# Patient Record
Sex: Female | Born: 1990 | Race: White | Hispanic: No | Marital: Single | Smoking: Current every day smoker
Health system: Southern US, Community
[De-identification: ages and names within clinical notes are randomized; demographics above are authoritative.]

## PROBLEM LIST (undated history)

## (undated) ENCOUNTER — Inpatient Hospital Stay: Payer: Self-pay

## (undated) DIAGNOSIS — R87619 Unspecified abnormal cytological findings in specimens from cervix uteri: Secondary | ICD-10-CM

## (undated) DIAGNOSIS — Z9289 Personal history of other medical treatment: Secondary | ICD-10-CM

## (undated) HISTORY — DX: Unspecified abnormal cytological findings in specimens from cervix uteri: R87.619

## (undated) HISTORY — PX: NO PAST SURGERIES: SHX2092

## (undated) HISTORY — DX: Personal history of other medical treatment: Z92.89

---

## 2009-05-09 ENCOUNTER — Ambulatory Visit: Payer: Self-pay | Admitting: Family Medicine

## 2010-06-09 HISTORY — PX: WISDOM TOOTH EXTRACTION: SHX21

## 2010-09-22 ENCOUNTER — Inpatient Hospital Stay: Payer: Self-pay | Admitting: Obstetrics and Gynecology

## 2012-10-17 ENCOUNTER — Inpatient Hospital Stay: Payer: Self-pay | Admitting: Obstetrics & Gynecology

## 2012-10-17 LAB — CBC WITH DIFFERENTIAL/PLATELET
Basophil #: 0 10*3/uL (ref 0.0–0.1)
Basophil %: 0.3 %
Eosinophil #: 0.1 10*3/uL (ref 0.0–0.7)
Eosinophil %: 0.4 %
HGB: 12.7 g/dL (ref 12.0–16.0)
Lymphocyte %: 16.4 %
MCH: 29.7 pg (ref 26.0–34.0)
MCHC: 34.7 g/dL (ref 32.0–36.0)
MCV: 86 fL (ref 80–100)
Monocyte #: 0.9 x10 3/mm (ref 0.2–0.9)
Neutrophil #: 10.7 10*3/uL — ABNORMAL HIGH (ref 1.4–6.5)
RBC: 4.29 10*6/uL (ref 3.80–5.20)
RDW: 14.2 % (ref 11.5–14.5)
WBC: 14 10*3/uL — ABNORMAL HIGH (ref 3.6–11.0)

## 2012-10-18 LAB — GC/CHLAMYDIA PROBE AMP

## 2012-10-19 LAB — HEMATOCRIT: HCT: 33.5 % — ABNORMAL LOW (ref 35.0–47.0)

## 2013-06-09 DIAGNOSIS — R87619 Unspecified abnormal cytological findings in specimens from cervix uteri: Secondary | ICD-10-CM

## 2013-06-09 HISTORY — DX: Unspecified abnormal cytological findings in specimens from cervix uteri: R87.619

## 2014-01-16 ENCOUNTER — Ambulatory Visit: Payer: Self-pay | Admitting: Physician Assistant

## 2014-01-16 LAB — RAPID STREP-A WITH REFLX: MICRO TEXT REPORT: NEGATIVE

## 2014-01-20 LAB — BETA STREP CULTURE(ARMC)

## 2014-03-04 ENCOUNTER — Ambulatory Visit: Payer: Self-pay | Admitting: Physician Assistant

## 2014-03-04 LAB — URINALYSIS, COMPLETE
BACTERIA: NEGATIVE
BLOOD: NEGATIVE
Bilirubin,UR: NEGATIVE
Glucose,UR: NEGATIVE
KETONE: NEGATIVE
LEUKOCYTE ESTERASE: NEGATIVE
Nitrite: NEGATIVE
PH: 7.5 (ref 5.0–8.0)
PROTEIN: NEGATIVE
RBC, UR: NONE SEEN /HPF (ref 0–5)
Specific Gravity: 1.025 (ref 1.000–1.030)
WBC UR: NONE SEEN /HPF (ref 0–5)

## 2014-03-06 LAB — URINE CULTURE

## 2014-06-22 ENCOUNTER — Ambulatory Visit: Payer: Self-pay | Admitting: Physician Assistant

## 2014-06-22 LAB — BASIC METABOLIC PANEL
Anion Gap: 8 (ref 7–16)
BUN: 13 mg/dL (ref 7–18)
CREATININE: 0.82 mg/dL (ref 0.60–1.30)
Calcium, Total: 9.1 mg/dL (ref 8.5–10.1)
Chloride: 105 mmol/L (ref 98–107)
Co2: 28 mmol/L (ref 21–32)
EGFR (African American): >60
GLUCOSE: 91 mg/dL (ref 65–99)
Osmolality: 281 (ref 275–301)
POTASSIUM: 4.1 mmol/L (ref 3.5–5.1)
Sodium: 141 mmol/L (ref 136–145)

## 2014-06-22 LAB — CBC WITH DIFFERENTIAL/PLATELET
BASOS ABS: 0.1 10*3/uL (ref 0.0–0.1)
Basophil %: 1.1 %
Eosinophil #: 0.1 10*3/uL (ref 0.0–0.7)
Eosinophil %: 1.5 %
HCT: 45.4 % (ref 35.0–47.0)
HGB: 14.8 g/dL (ref 12.0–16.0)
LYMPHS PCT: 38.4 %
Lymphocyte #: 2.7 10*3/uL (ref 1.0–3.6)
MCH: 29 pg (ref 26.0–34.0)
MCHC: 32.7 g/dL (ref 32.0–36.0)
MCV: 89 fL (ref 80–100)
Monocyte #: 0.5 x10 3/mm (ref 0.2–0.9)
Monocyte %: 6.7 %
NEUTROS ABS: 3.7 10*3/uL (ref 1.4–6.5)
Neutrophil %: 52.3 %
Platelet: 261 10*3/uL (ref 150–440)
RBC: 5.11 10*6/uL (ref 3.80–5.20)
RDW: 13 % (ref 11.5–14.5)
WBC: 7 10*3/uL (ref 3.6–11.0)

## 2014-11-27 ENCOUNTER — Other Ambulatory Visit: Payer: Self-pay | Admitting: Family Medicine

## 2014-11-27 ENCOUNTER — Ambulatory Visit
Admission: RE | Admit: 2014-11-27 | Discharge: 2014-11-27 | Disposition: A | Discharge status: Home / Self Care | Payer: PRIVATE HEALTH INSURANCE | Source: Ambulatory Visit | Attending: Family Medicine | Admitting: Family Medicine

## 2014-11-27 DIAGNOSIS — Z77098 Contact with and (suspected) exposure to other hazardous, chiefly nonmedicinal, chemicals: Secondary | ICD-10-CM

## 2014-11-27 DIAGNOSIS — Z029 Encounter for administrative examinations, unspecified: Secondary | ICD-10-CM | POA: Diagnosis present

## 2015-09-02 ENCOUNTER — Ambulatory Visit
Admission: EM | Admit: 2015-09-02 | Discharge: 2015-09-02 | Disposition: A | Discharge status: Home / Self Care | Payer: PRIVATE HEALTH INSURANCE | Attending: Family Medicine | Admitting: Family Medicine

## 2015-09-02 DIAGNOSIS — J302 Other seasonal allergic rhinitis: Secondary | ICD-10-CM

## 2015-09-02 DIAGNOSIS — T700XXA Otitic barotrauma, initial encounter: Secondary | ICD-10-CM

## 2015-09-02 NOTE — Discharge Instructions (Signed)
Sinus congestion with Eustachian tube dysfunction....  Robitussin DM  ( guafenesin DM )  1 tsp every 4-6 hours  Ceterizine 10 mg 1 tablet daily .Marland Kitchen..for 2-3 days OK to take one tablet twice a day  Fluticasone 1 spray per nostril twice daily for 3-5 days then reduce to daily  Increase fluids by mouth--- steamy showers-- vaporizer....  Barotitis Media Barotitis media is inflammation of your middle ear. This occurs when the auditory tube (eustachian tube) leading from the back of your nose (nasopharynx) to your eardrum is blocked. This blockage may result from a cold, environmental allergies, or an upper respiratory infection. Unresolved barotitis media may lead to damage or hearing loss (barotrauma), which may become permanent. HOME CARE INSTRUCTIONS   Use medicines as recommended by your health care provider. Over-the-counter medicines will help unblock the canal and can help during times of air travel.  Do not put anything into your ears to clean or unplug them. Eardrops will not be helpful.  Do not swim, dive, or fly until your health care provider says it is all right to do so. If these activities are necessary, chewing gum with frequent, forceful swallowing may help. It is also helpful to hold your nose and gently blow to pop your ears for equalizing pressure changes. This forces air into the eustachian tube.  Only take over-the-counter or prescription medicines for pain, discomfort, or fever as directed by your health care provider.  A decongestant may be helpful in decongesting the middle ear and make pressure equalization easier. SEEK MEDICAL CARE IF:  You experience a serious form of dizziness in which you feel as if the room is spinning and you feel nauseated (vertigo).  Your symptoms only involve one ear. SEEK IMMEDIATE MEDICAL CARE IF:   You develop a severe headache, dizziness, or severe ear pain.  You have bloody or pus-like drainage from your ears.  You develop a  fever.  Your problems do not improve or become worse. MAKE SURE YOU:   Understand these instructions.  Will watch your condition.  Will get help right away if you are not doing well or get worse.   This information is not intended to replace advice given to you by your health care provider. Make sure you discuss any questions you have with your health care provider.   Document Released: 05/23/2000 Document Revised: 03/16/2013 Document Reviewed: 12/21/2012 Elsevier Interactive Patient Education [employer]2016 Elsevier Inc.

## 2015-09-02 NOTE — ED Notes (Signed)
Patient c/o sinus pressure, fever, coughing which started 1 week ago.

## 2015-09-02 NOTE — ED Provider Notes (Signed)
CSN: [id number]     Arrival date & time 09/02/15  [id number] History   First MD Initiated Contact with Patient 09/02/15 1010     Chief Complaint  Patient presents with  . URI   (Consider location/radiation/quality/duration/timing/severity/associated sxs/prior Treatment) HPI  25 yo F reporting "about a week" of nasal congestion ...worse over last few days. Now feels underwater...both ears have pressure No thermometer- has felt warm some evenings. No documented fevers, Presents for sinus/ears pressure. No facial pain,no tooth pain . No hx of seasonal allergies but feels this way "in the Spring". Has post nasal drip, ears squeak/pop with gentle valsalva Smokes a pack a day  History reviewed. No pertinent past medical history. History reviewed. No pertinent past surgical history. History reviewed. No pertinent family history. Social History  Substance Use Topics  . Smoking status: Current Every Day Smoker -- 1.00 packs/day    Types: Cigarettes  . Smokeless tobacco: Never Used  . Alcohol Use: Yes     Comment: Occ.   OB History    No data available     Review of Systems Review of 10 systems negative for acute change except as referenced in HPI  Review of patient's allergies indicates no known allergies.  Home Medications   Prior to Admission medications   Medication Sig Start Date End Date Taking? Authorizing Provider  amoxicillin (AMOXIL) 500 MG capsule Take 1 capsule (500 mg total) by mouth 3 (three) times daily. 09/03/15   Duanne Limerickeanna C Jones, MD   Meds Ordered and Administered this Visit  Medications - No data to display  BP 108/73 mmHg  Pulse 100  Temp(Src) 98.2 F (36.8 C) (Oral)  Resp 19  Ht 5\' 6"  (1.676 m)  Wt 146 lb (66.225 kg)  BMI 23.58 kg/m2  SpO2 100%  LMP 08/05/2015 No data found.   Physical Exam   VS noted, WNL  GENERAL : NAD HEENT: no pharyngeal erythema,positive cobblestoning; no exudate:  no erythema of TMs, + dullness of TMs with distorted light reflex  bilat;  no cervical LAD- nasal congestion present RESP: CTA  B , no wheezing, no accessory muscle use CARD: RRR ABD: Not distended NEURO: Good attention, good recall, no gross neuro defecit PSYCH: speech and behavior appropriate  ED Course  Procedures (including critical care time)  Labs Review Labs Reviewed - No data to display  Imaging Review No results found.  Discussed seasonal allergies and eustachian tube dysfunction Interventions     MDM   1. Barotitis media, initial encounter   2. Seasonal allergies    Plan Guaifenesin DM, Ceterizine, Futicasone--increased hydration, warm showers, vaporizer if available OTC intervention - has no insurance Diagnosis and treatment discussed.-handout given   Questions fielded, expectations and recommendations reviewed. Discussed follow up and return parameters including no resolution or any worsening condition. . Patient expresses understanding  and agrees to plan.  [facility] concerns or exacerbation.      Rae HalstedLaurie W Aleayah Chico, PA-C 09/05/15 1110

## 2015-09-03 ENCOUNTER — Other Ambulatory Visit: Payer: Self-pay

## 2015-09-03 DIAGNOSIS — J0110 Acute frontal sinusitis, unspecified: Secondary | ICD-10-CM

## 2015-09-03 MED ORDER — AMOXICILLIN 500 MG PO CAPS: 500.0000 mg | ORAL_CAPSULE | Freq: Three times a day (TID) | ORAL | Status: DC

## 2015-12-01 ENCOUNTER — Ambulatory Visit
Admission: EM | Admit: 2015-12-01 | Discharge: 2015-12-01 | Disposition: A | Discharge status: Home / Self Care | Payer: Medicaid Other | Attending: Nurse Practitioner | Admitting: Nurse Practitioner

## 2015-12-01 DIAGNOSIS — A499 Bacterial infection, unspecified: Secondary | ICD-10-CM

## 2015-12-01 DIAGNOSIS — B9689 Other specified bacterial agents as the cause of diseases classified elsewhere: Secondary | ICD-10-CM

## 2015-12-01 DIAGNOSIS — N760 Acute vaginitis: Secondary | ICD-10-CM

## 2015-12-01 LAB — CHLAMYDIA/NGC RT PCR (ARMC ONLY)
CHLAMYDIA TR: NOT DETECTED
N GONORRHOEAE: NOT DETECTED

## 2015-12-01 LAB — WET PREP, GENITAL
SPERM: NONE SEEN
Trich, Wet Prep: NONE SEEN
Yeast Wet Prep HPF POC: NONE SEEN

## 2015-12-01 MED ORDER — METRONIDAZOLE 500 MG PO TABS: 500.0000 mg | ORAL_TABLET | Freq: Two times a day (BID) | ORAL | Status: DC

## 2015-12-01 NOTE — ED Provider Notes (Signed)
CSN: [id number]     Arrival date & time 12/01/15  0915 History   None    Chief Complaint  Patient presents with  . Vaginal Discharge    Pt with unprotected sex one week ago. Started with yellowish vaginal discharge and itching a few days ago.    (Consider location/radiation/quality/duration/timing/severity/associated sxs/prior Treatment) HPI Comments: Patient presents today for 2-day history of odorous vaginal discharge and vaginal itchiness. Discharge is pinkish to yellowish; she is currently on her last few days of her menstrual cycle. Denies vaginal irritation or burning sensation. Denies dysuria and abdominal pain. She is sexually active with 1 partner. Modifying factor: None.     Patient is a 25 y.o. female presenting with vaginal discharge.  Vaginal Discharge Associated symptoms: no abdominal pain, no dysuria, no fever and no nausea     Past Medical History  Diagnosis Date  . Patient denies medical problems    History reviewed. No pertinent past surgical history. History reviewed. No pertinent family history. Social History  Substance Use Topics  . Smoking status: Current Every Day Smoker -- 1.00 packs/day    Types: Cigarettes  . Smokeless tobacco: Never Used  . Alcohol Use: Yes     Comment: Occ.   OB History    No data available     Review of Systems  Constitutional: Negative for fever and fatigue.  Respiratory: Negative for shortness of breath.   Cardiovascular: Negative for chest pain.  Gastrointestinal: Negative for nausea and abdominal pain.  Genitourinary: Positive for vaginal discharge. Negative for dysuria, frequency, vaginal pain and pelvic pain.       Vaginal itchiness; see HPI    Allergies  Review of patient's allergies indicates no known allergies.  Home Medications   Prior to Admission medications   Medication Sig Start Date End Date Taking? Authorizing Provider  norethindrone-ethinyl estradiol (CYCLAFEM,ALYACEN) 0.5/0.75/1-35 MG-MCG tablet Take  1 tablet by mouth daily.   Yes Historical Provider, MD  amoxicillin (AMOXIL) 500 MG capsule Take 1 capsule (500 mg total) by mouth 3 (three) times daily. 09/03/15   Duanne Limerickeanna C Jones, MD  metroNIDAZOLE (FLAGYL) 500 MG tablet Take 1 tablet (500 mg total) by mouth 2 (two) times daily. 12/01/15   Lucia EstelleFeng Vianne Grieshop, NP   Meds Ordered and Administered this Visit  Medications - No data to display  BP 117/96 mmHg  Pulse 92  Temp(Src) 97.7 F (36.5 C) (Axillary)  Resp 18  Ht 5\' 6"  (1.676 m)  Wt 140 lb (63.504 kg)  BMI 22.61 kg/m2  SpO2 99%  LMP 11/24/2015 No data found.   Physical Exam  Constitutional: She appears well-developed and well-nourished.  Cardiovascular: Normal rate, regular rhythm and normal heart sounds.   Pulmonary/Chest: Effort normal and breath sounds normal.  Abdominal: Soft. Bowel sounds are normal.  Genitourinary: Vaginal discharge found.  Labia minor and labia majora symmetrical. Normal hair distribution. Small amount of thin white discharge noted on the vaginal entrance. Vaginal wall without lesion or erythema; however moderate amount of thin white discharge noted. Cervix midline, smooth, firm and non-tender on palpation.     ED Course  Procedures (including critical care time)  Labs Review Labs Reviewed  WET PREP, GENITAL - Abnormal; Notable for the following:    Clue Cells Wet Prep HPF POC PRESENT (*)    WBC, Wet Prep HPF POC FEW (*)    All other components within normal limits  CHLAMYDIA/NGC RT PCR [facility] ONLY)    Imaging Review No results found.   Visual  Acuity Review  Right Eye Distance:   Left Eye Distance:   Bilateral Distance:    Right Eye Near:   Left Eye Near:    Bilateral Near:         MDM   1. BV (bacterial vaginosis)    Prescriptions given. Reviewed directions for usage and side effects. Patient states understanding and will call with questions or problems. Patient instructed to call or follow up with his/her primary care doctor if  failure to improve or change in symptoms. Discharge instruction given.      Lucia EstelleFeng Alayna Mabe, NP 12/01/15 1113

## 2015-12-01 NOTE — Discharge Instructions (Signed)

## 2016-02-01 ENCOUNTER — Encounter: Payer: Self-pay | Admitting: Family Medicine

## 2016-02-01 ENCOUNTER — Ambulatory Visit (INDEPENDENT_AMBULATORY_CARE_PROVIDER_SITE_OTHER): Payer: Medicaid Other | Admitting: Family Medicine

## 2016-02-01 VITALS — BP 130/100 | HR 80 | Ht 66.0 in | Wt 138.0 lb

## 2016-02-01 DIAGNOSIS — F419 Anxiety disorder, unspecified: Secondary | ICD-10-CM

## 2016-02-01 DIAGNOSIS — F430 Acute stress reaction: Secondary | ICD-10-CM

## 2016-02-01 DIAGNOSIS — F410 Panic disorder [episodic paroxysmal anxiety] without agoraphobia: Secondary | ICD-10-CM | POA: Diagnosis not present

## 2016-02-01 MED ORDER — SERTRALINE HCL 50 MG PO TABS: 50.0000 mg | ORAL_TABLET | Freq: Every day | ORAL | 3 refills | Status: DC

## 2016-02-01 MED ORDER — ALPRAZOLAM 0.25 MG PO TABS: 0.2500 mg | ORAL_TABLET | Freq: Two times a day (BID) | ORAL | 0 refills | Status: DC | PRN

## 2016-02-01 NOTE — Progress Notes (Signed)
Name: Tanya Mccarthy   MRN: [id number]    DOB: 22-Aug-1990   Date:02/01/2016       Progress Note  Subjective  Chief Complaint  Chief Complaint  Patient presents with  . Anxiety    wake up feeling like "my heart is racing and even when I talk myself down, physically it keeps going"    Anxiety  Presents for initial visit. Onset was 1 to 4 weeks ago. The problem has been gradually worsening. Symptoms include excessive worry, feeling of choking, hyperventilation, irritability, nervous/anxious behavior, palpitations, panic and shortness of breath. Patient reports no chest pain, depressed mood, dizziness, insomnia, nausea or suicidal ideas. Symptoms occur most days. The severity of symptoms is moderate. The symptoms are aggravated by work stress and family issues.   Risk factors include family history and a major life event. There is no history of anxiety/panic attacks.  Depression         This is a chronic problem.  The current episode started 1 to 4 weeks ago.   The onset quality is gradual.   Associated symptoms include does not have insomnia, no myalgias, no headaches and no suicidal ideas.  Compliance with treatment is good.  Past medical history includes anxiety.     No problem-specific Assessment & Plan notes found for this encounter.   Past Medical History:  Diagnosis Date  . Patient denies medical problems     No past surgical history on file.  No family history on file.  Social History   Social History  . Marital status: Single    Spouse name: N/A  . Number of children: N/A  . Years of education: N/A   Occupational History  . Not on file.   Social History Main Topics  . Smoking status: Current Every Day Smoker    Packs/day: 1.00    Types: Cigarettes  . Smokeless tobacco: Never Used  . Alcohol use Yes     Comment: Occ.  . Drug use: Unknown  . Sexual activity: Yes   Other Topics Concern  . Not on file   Social History Narrative  . No narrative on file    No  Known Allergies   Review of Systems  Constitutional: Positive for irritability. Negative for chills, fever, malaise/fatigue and weight loss.  HENT: Negative for ear discharge, ear pain and sore throat.   Eyes: Negative for blurred vision.  Respiratory: Positive for shortness of breath. Negative for cough, sputum production and wheezing.   Cardiovascular: Positive for palpitations. Negative for chest pain and leg swelling.  Gastrointestinal: Negative for abdominal pain, blood in stool, constipation, diarrhea, heartburn, melena and nausea.  Genitourinary: Negative for dysuria, frequency, hematuria and urgency.  Musculoskeletal: Negative for back pain, joint pain, myalgias and neck pain.  Skin: Negative for rash.  Neurological: Negative for dizziness, tingling, sensory change, focal weakness and headaches.  Endo/Heme/Allergies: Negative for environmental allergies and polydipsia. Does not bruise/bleed easily.  Psychiatric/Behavioral: Positive for depression. Negative for suicidal ideas. The patient is nervous/anxious. The patient does not have insomnia.      Objective  Vitals:   02/01/16 1546  BP: (!) 130/100  Pulse: 80  Weight: 138 lb (62.6 kg)  Height: 5\' 6"  (1.676 m)    Physical Exam  Constitutional: She is well-developed, well-nourished, and in no distress. No distress.  HENT:  Head: Normocephalic and atraumatic.  Right Ear: External ear normal.  Left Ear: External ear normal.  Nose: Nose normal.  Mouth/Throat: Oropharynx is clear and moist.  Eyes: Conjunctivae and EOM are normal. Pupils are equal, round, and reactive to light. Right eye exhibits no discharge. Left eye exhibits no discharge.  Neck: Normal range of motion. Neck supple. No JVD present. No thyromegaly present.  Cardiovascular: Normal rate, regular rhythm, normal heart sounds and intact distal pulses.  Exam reveals no gallop and no friction rub.   No murmur heard. Pulmonary/Chest: Effort normal and breath sounds  normal.  Abdominal: Soft. Bowel sounds are normal. She exhibits no mass. There is no tenderness. There is no guarding.  Musculoskeletal: Normal range of motion. She exhibits no edema.  Lymphadenopathy:    She has no cervical adenopathy.  Neurological: She is alert. She has normal reflexes.  Skin: Skin is warm and dry. She is not diaphoretic.  Psychiatric: Mood and affect normal.  Nursing note and vitals reviewed.     Assessment & Plan  Problem List Items Addressed This Visit    None    Visit Diagnoses    Acute anxiety    -  Primary   Relevant Medications   sertraline (ZOLOFT) 50 MG tablet   ALPRAZolam (XANAX) 0.25 MG tablet   Panic attack as reaction to stress       Relevant Medications   sertraline (ZOLOFT) 50 MG tablet   ALPRAZolam (XANAX) 0.25 MG tablet        Dr. Hayden Rasmusseneanna Morty Ortwein [facility]  02/01/16

## 2016-02-01 NOTE — Patient Instructions (Signed)
Generalized Anxiety Disorder Generalized anxiety disorder (GAD) is a mental disorder. It interferes with life functions, including relationships, work, and school. GAD is different from normal anxiety, which everyone experiences at some point in their lives in response to specific life events and activities. Normal anxiety actually helps us prepare for and get through these life events and activities. Normal anxiety goes away after the event or activity is over.  GAD causes anxiety that is not necessarily related to specific events or activities. It also causes excess anxiety in proportion to specific events or activities. The anxiety associated with GAD is also difficult to control. GAD can vary from mild to severe. People with severe GAD can have intense waves of anxiety with physical symptoms (panic attacks).  SYMPTOMS The anxiety and worry associated with GAD are difficult to control. This anxiety and worry are related to many life events and activities and also occur more days than not for 6 months or longer. People with GAD also have three or more of the following symptoms (one or more in children):  Restlessness.   Fatigue.  Difficulty concentrating.   Irritability.  Muscle tension.  Difficulty sleeping or unsatisfying sleep. DIAGNOSIS GAD is diagnosed through an assessment by your health care provider. Your health care provider will ask you questions aboutyour mood,physical symptoms, and events in your life. Your health care provider may ask you about your medical history and use of alcohol or drugs, including prescription medicines. Your health care provider may also do a physical exam and blood tests. Certain medical conditions and the use of certain substances can cause symptoms similar to those associated with GAD. Your health care provider may refer you to a mental health specialist for further evaluation. TREATMENT The following therapies are usually used to treat GAD:    Medication. Antidepressant medication usually is prescribed for long-term daily control. Antianxiety medicines may be added in severe cases, especially when panic attacks occur.   Talk therapy (psychotherapy). Certain types of talk therapy can be helpful in treating GAD by providing support, education, and guidance. A form of talk therapy called cognitive behavioral therapy can teach you healthy ways to think about and react to daily life events and activities.  Stress managementtechniques. These include yoga, meditation, and exercise and can be very helpful when they are practiced regularly. A mental health specialist can help determine which treatment is best for you. Some people see improvement with one therapy. However, other people require a combination of therapies.   This information is not intended to replace advice given to you by your health care provider. Make sure you discuss any questions you have with your health care provider.   Document Released: 09/20/2012 Document Revised: 06/16/2014 Document Reviewed: 09/20/2012 Elsevier Interactive Patient Education 2016 [employer] Inc.  

## 2016-02-02 ENCOUNTER — Emergency Department
Admission: EM | Admit: 2016-02-02 | Discharge: 2016-02-02 | Disposition: A | Discharge status: Home / Self Care | Payer: Medicaid Other | Attending: Emergency Medicine | Admitting: Emergency Medicine

## 2016-02-02 ENCOUNTER — Encounter: Payer: Self-pay | Admitting: Emergency Medicine

## 2016-02-02 DIAGNOSIS — T7840XA Allergy, unspecified, initial encounter: Secondary | ICD-10-CM

## 2016-02-02 DIAGNOSIS — T43295A Adverse effect of other antidepressants, initial encounter: Secondary | ICD-10-CM | POA: Diagnosis not present

## 2016-02-02 DIAGNOSIS — Y828 Other medical devices associated with adverse incidents: Secondary | ICD-10-CM | POA: Insufficient documentation

## 2016-02-02 DIAGNOSIS — R609 Edema, unspecified: Secondary | ICD-10-CM | POA: Insufficient documentation

## 2016-02-02 DIAGNOSIS — T887XXA Unspecified adverse effect of drug or medicament, initial encounter: Secondary | ICD-10-CM | POA: Insufficient documentation

## 2016-02-02 MED ORDER — PREDNISONE 20 MG PO TABS: 40.0000 mg | ORAL_TABLET | Freq: Every day | ORAL | 0 refills | Status: DC

## 2016-02-02 MED ORDER — ONDANSETRON 4 MG PO TBDP
4.0000 mg | ORAL_TABLET | Freq: Once | ORAL | Status: AC
  Administered 2016-02-02: 4 mg via ORAL

## 2016-02-02 MED ORDER — PREDNISONE 20 MG PO TABS
ORAL_TABLET | ORAL | Status: AC
  Administered 2016-02-02: 40 mg via ORAL
  Filled 2016-02-02: qty 2

## 2016-02-02 MED ORDER — ONDANSETRON 4 MG PO TBDP
ORAL_TABLET | ORAL | Status: AC
  Administered 2016-02-02: 4 mg via ORAL
  Filled 2016-02-02: qty 1

## 2016-02-02 MED ORDER — DIPHENHYDRAMINE HCL 25 MG PO CAPS
25.0000 mg | ORAL_CAPSULE | Freq: Once | ORAL | Status: AC
  Administered 2016-02-02: 25 mg via ORAL

## 2016-02-02 MED ORDER — DIPHENHYDRAMINE HCL 25 MG PO CAPS
ORAL_CAPSULE | ORAL | Status: AC
  Administered 2016-02-02: 25 mg via ORAL
  Filled 2016-02-02: qty 1

## 2016-02-02 MED ORDER — PREDNISONE 20 MG PO TABS
40.0000 mg | ORAL_TABLET | Freq: Once | ORAL | Status: AC
  Administered 2016-02-02: 40 mg via ORAL

## 2016-02-02 NOTE — ED Triage Notes (Signed)
Pt. States starting zoloft for the first time tonight at around 1930.  Pt. States at around 2 am, she noticed swelling to lips and throat.

## 2016-02-02 NOTE — Discharge Instructions (Signed)
You have been seen in the Emergency Department (ED) today for an allergic reaction.  You have been stable throughout your stay in the Emergency Department.  Please take your medications as prescribed and follow up with your doctor as indicated.  You should also take over-the-counter Benadryl around the clock for the next three days according to the dosing instructions on the package.  Do not take any additional Zoloft and follow up with the prescribing doctor.  Return to the Emergency Department (ED) if you experience any worsening or new symptoms that concern you.

## 2016-02-02 NOTE — ED Provider Notes (Signed)
[facility] Emergency Department Provider Note  ____________________________________________   First MD Initiated Contact with Patient 02/02/16 [phone]     (approximate)  I have reviewed the triage vital signs and the nursing notes.   HISTORY  Chief Complaint Allergic Reaction (Pt. states she took zooloft for the first time tonight at around 19:30 this evening.  Pt. states she woke at around 2 am with swelling to lips and throat)    HPI Tanya Mccarthy is a 25 y.o. female with a history of depression and anxiety who presents for evaluation of possible allergic reaction to Zoloft.  She states she was prescribed Zoloft yesterday along with some Xanax (which she has had before).  She took the first dose of Zoloft before going to bed and that she awoke several hours later with swelling to her lips and tongue.  She became concerned and then developed some sensation of shortness of breath and lightheadedness as well.  She tried to stretch her mom's also get some Benadryl but she became more lightheaded and thought she would pass out so she stopped and called EMS.  She describes symptoms as moderate to severe but they have also completely resolved and had mostly resolved even prior to treatment in the emergency department.  She denies chest pain, vomiting, abdominal pain.  He had no other exposures to other allergens which she is aware.  Past Medical History:  Diagnosis Date  . Patient denies medical problems     There are no active problems to display for this patient.   History reviewed. No pertinent surgical history.  Prior to Admission medications   Medication Sig Start Date End Date Taking? Authorizing Provider  norethindrone-ethinyl estradiol (CYCLAFEM,ALYACEN) 0.5/0.75/1-35 MG-MCG tablet Take 1 tablet by mouth daily.   Yes Historical Provider, MD  ALPRAZolam (XANAX) 0.25 MG tablet Take 1 tablet (0.25 mg total) by mouth 2 (two) times daily as needed for anxiety.  Prn use 02/01/16   Duanne Limerick, MD  predniSONE (DELTASONE) 20 MG tablet Take 2 tablets (40 mg total) by mouth daily. 02/02/16   Loleta Rose, MD  sertraline (ZOLOFT) 50 MG tablet Take 1 tablet (50 mg total) by mouth daily. 02/01/16   Duanne Limerick, MD    Allergies Zoloft [sertraline hcl]  History reviewed. No pertinent family history.  Social History Social History  Substance Use Topics  . Smoking status: Current Every Day Smoker    Packs/day: 1.00    Types: Cigarettes  . Smokeless tobacco: Never Used  . Alcohol use Yes     Comment: Occ.    Review of Systems Constitutional: No fever/chills Eyes: No visual changes. ENT: No sore throat.Swelling of lips and tongue Cardiovascular: Denies chest pain. Respiratory: +shortness of breath. Gastrointestinal: No abdominal pain.  No nausea, no vomiting.  No diarrhea.  No constipation. Genitourinary: Negative for dysuria. Musculoskeletal: Negative for back pain. Skin: Negative for rash. Neurological: Negative for headaches, focal weakness or numbness.  10-point ROS otherwise negative.  ____________________________________________   PHYSICAL EXAM:  VITAL SIGNS: ED Triage Vitals [02/02/16 0345]  Enc Vitals Group     BP 119/90     Pulse Rate 87     Resp 16     Temp 97.4 F (36.3 C)     Temp Source Oral     SpO2 100 %     Weight 135 lb (61.2 kg)     Height 5\' 6"  (1.676 m)     Head Circumference  Peak Flow      Pain Score      Pain Loc      Pain Edu?      Excl. in GC?     Constitutional: Alert and oriented. Well appearing and in no acute distress. Eyes: Conjunctivae are normal. PERRL. EOMI. Head: Atraumatic. Nose: No congestion/rhinnorhea. Mouth/Throat: Mucous membranes are moist.  Oropharynx non-erythematous. Neck: No stridor.  No meningeal signs.   Cardiovascular: Normal rate, regular rhythm. Good peripheral circulation. Grossly normal heart sounds. Respiratory: Normal respiratory effort.  No retractions. Lungs  CTAB. Gastrointestinal: Soft and nontender. No distention.  Musculoskeletal: No lower extremity tenderness nor edema. No gross deformities of extremities. Neurologic:  Normal speech and language. No gross focal neurologic deficits are appreciated.  Skin:  Skin is warm, dry and intact. No rash noted. Psychiatric: Mood and affect are normal. Speech and behavior are normal.  ____________________________________________   LABS (all labs ordered are listed, but only abnormal results are displayed)  Labs Reviewed - No data to display ____________________________________________  EKG  None - EKG not ordered by ED physician ____________________________________________  RADIOLOGY   No results found.  ____________________________________________   PROCEDURES  Procedure(s) performed:   Procedures   Critical Care performed: No ____________________________________________   INITIAL IMPRESSION / ASSESSMENT AND PLAN / ED COURSE  Pertinent labs & imaging results that were available during my care of the patient were reviewed by me and considered in my medical decision making (see chart for details).  The patient is well-appearing and in no acute distress with no evidence of angioedema both externally and in her pharynx.  It is very possible she may have had a reaction to Zoloft and I suspect she also had an anxiety reaction to seeing the swelling that has since resolved.  I ordered 25 mg of Benadryl by mouth and 40 mg of prednisone by mouth when she first arrived.  I will give her a short course of prednisone and encouraged her to continue taking Benadryl as needed but there is no indication that she suffered from anaphylaxis.  I encouraged her to not take any additional Zoloft and follow-up with her regular doctor at the next available opportunity. ____________________________________________  FINAL CLINICAL IMPRESSION(S) / ED DIAGNOSES  Final diagnoses:  Allergic reaction caused  by a drug     MEDICATIONS GIVEN DURING THIS VISIT:  Medications  predniSONE (DELTASONE) tablet 40 mg (40 mg Oral Given 02/02/16 0359)  diphenhydrAMINE (BENADRYL) capsule 25 mg (25 mg Oral Given 02/02/16 0359)  ondansetron (ZOFRAN-ODT) disintegrating tablet 4 mg (4 mg Oral Given 02/02/16 0437)     NEW OUTPATIENT MEDICATIONS STARTED DURING THIS VISIT:  Discharge Medication List as of 02/02/2016  6:09 AM    START taking these medications   Details  predniSONE (DELTASONE) 20 MG tablet Take 2 tablets (40 mg total) by mouth daily., Starting Sat 02/02/2016, Print          Note:  This document was prepared using Dragon voice recognition software and may include unintentional dictation errors.    Loleta Roseory Aadon Gorelik, MD 02/02/16 [phone]

## 2016-02-02 NOTE — ED Notes (Signed)
Pt. Going home with mother. 

## 2016-02-12 ENCOUNTER — Ambulatory Visit: Payer: Medicaid Other | Admitting: Family Medicine

## 2016-02-22 ENCOUNTER — Other Ambulatory Visit: Payer: Self-pay

## 2016-02-22 DIAGNOSIS — F419 Anxiety disorder, unspecified: Secondary | ICD-10-CM

## 2016-03-14 ENCOUNTER — Ambulatory Visit: Payer: Medicaid Other | Admitting: Family Medicine

## 2016-03-25 ENCOUNTER — Ambulatory Visit: Payer: Self-pay | Admitting: Psychiatry

## 2016-08-14 ENCOUNTER — Encounter: Payer: Self-pay | Admitting: Family Medicine

## 2016-08-14 ENCOUNTER — Ambulatory Visit (INDEPENDENT_AMBULATORY_CARE_PROVIDER_SITE_OTHER): Payer: Medicaid Other | Admitting: Family Medicine

## 2016-08-14 VITALS — BP 120/80 | HR 88 | Ht 66.0 in | Wt 136.0 lb

## 2016-08-14 DIAGNOSIS — J0100 Acute maxillary sinusitis, unspecified: Secondary | ICD-10-CM

## 2016-08-14 DIAGNOSIS — R1033 Periumbilical pain: Secondary | ICD-10-CM

## 2016-08-14 MED ORDER — AMOXICILLIN-POT CLAVULANATE 875-125 MG PO TABS: 1.0000 | ORAL_TABLET | Freq: Two times a day (BID) | ORAL | 0 refills | Status: DC

## 2016-08-14 NOTE — Progress Notes (Signed)
Name: Tanya Mccarthy   MRN: [id number]    DOB: May 19, 1991   Date:08/14/2016       Progress Note  Subjective  Chief Complaint  Chief Complaint  Patient presents with  . Sinusitis    cough and L) ear feels "stopped up" x 3 weeks. - tried otc med    Sinusitis  This is a new problem. The current episode started in the past 7 days. The problem has been waxing and waning since onset. There has been no fever. The fever has been present for 3 to 4 days. Associated symptoms include congestion, coughing, ear pain and sinus pressure. Pertinent negatives include no chills, diaphoresis, headaches, hoarse voice, neck pain, shortness of breath, sneezing, sore throat or swollen glands. Past treatments include oral decongestants. The treatment provided mild relief.  Abdominal Pain  This is a new problem. The current episode started today. The onset quality is sudden. The problem occurs constantly. The problem has been waxing and waning. The pain is located in the periumbilical region. The pain is at a severity of 3/10. The pain is moderate. The quality of the pain is aching, sharp and dull. The abdominal pain does not radiate. Associated symptoms include melena and nausea. Pertinent negatives include no anorexia, arthralgias, constipation, diarrhea, dysuria, fever, frequency, headaches, hematochezia, hematuria, myalgias, vomiting or weight loss. Associated symptoms comments: menustual period started today. The pain is aggravated by movement (palpation). She has tried antacids for the symptoms. The treatment provided no relief.    No problem-specific Assessment & Plan notes found for this encounter.   Past Medical History:  Diagnosis Date  . Patient denies medical problems     No past surgical history on file.  No family history on file.  Social History   Social History  . Marital status: Single    Spouse name: N/A  . Number of children: N/A  . Years of education: N/A   Occupational History  . Not  on file.   Social History Main Topics  . Smoking status: Current Every Day Smoker    Packs/day: 1.00    Types: Cigarettes  . Smokeless tobacco: Never Used  . Alcohol use Yes     Comment: Occ.  . Drug use: No  . Sexual activity: Yes    Birth control/ protection: Pill   Other Topics Concern  . Not on file   Social History Narrative  . No narrative on file    Allergies  Allergen Reactions  . Zoloft [Sertraline Hcl] Swelling    Outpatient Medications Prior to Visit  Medication Sig Dispense Refill  . ALPRAZolam (XANAX) 0.25 MG tablet Take 1 tablet (0.25 mg total) by mouth 2 (two) times daily as needed for anxiety. Prn use 20 tablet 0  . norethindrone-ethinyl estradiol (CYCLAFEM,ALYACEN) 0.5/0.75/1-35 MG-MCG tablet Take 1 tablet by mouth daily.    . predniSONE (DELTASONE) 20 MG tablet Take 2 tablets (40 mg total) by mouth daily. 10 tablet 0  . sertraline (ZOLOFT) 50 MG tablet Take 1 tablet (50 mg total) by mouth daily. 30 tablet 3   No facility-administered medications prior to visit.     Review of Systems  Constitutional: Negative for chills, diaphoresis, fever, malaise/fatigue and weight loss.  HENT: Positive for congestion, ear pain and sinus pressure. Negative for ear discharge, hoarse voice, sneezing and sore throat.   Eyes: Negative for blurred vision.  Respiratory: Positive for cough. Negative for sputum production, shortness of breath and wheezing.   Cardiovascular: Negative for chest pain,  palpitations and leg swelling.  Gastrointestinal: Positive for abdominal pain, melena and nausea. Negative for anorexia, blood in stool, constipation, diarrhea, heartburn, hematochezia and vomiting.  Genitourinary: Negative for dysuria, frequency, hematuria and urgency.  Musculoskeletal: Negative for arthralgias, back pain, joint pain, myalgias and neck pain.  Skin: Negative for rash.  Neurological: Negative for dizziness, tingling, sensory change, focal weakness and headaches.   Endo/Heme/Allergies: Negative for environmental allergies and polydipsia. Does not bruise/bleed easily.  Psychiatric/Behavioral: Negative for depression and suicidal ideas. The patient is not nervous/anxious and does not have insomnia.      Objective  Vitals:   08/14/16 1536  BP: 120/80  Pulse: 88  Weight: 136 lb (61.7 kg)  Height: 5\' 6"  (1.676 m)    Physical Exam  Constitutional: She is well-developed, well-nourished, and in no distress. No distress.  HENT:  Head: Normocephalic and atraumatic.  Right Ear: External ear normal.  Left Ear: External ear normal.  Nose: Nose normal.  Mouth/Throat: Oropharynx is clear and moist.  Eyes: Conjunctivae and EOM are normal. Pupils are equal, round, and reactive to light. Right eye exhibits no discharge. Left eye exhibits no discharge.  Neck: Normal range of motion. Neck supple. No JVD present. No thyromegaly present.  Cardiovascular: Normal rate, regular rhythm, normal heart sounds and intact distal pulses.  Exam reveals no gallop and no friction rub.   No murmur heard. Pulmonary/Chest: Effort normal and breath sounds normal. She has no wheezes. She has no rales.  Abdominal: Soft. Bowel sounds are normal. She exhibits no distension and no mass. There is no hepatosplenomegaly. There is tenderness in the periumbilical area. There is no rebound and no guarding.  Musculoskeletal: Normal range of motion. She exhibits no edema.  Lymphadenopathy:    She has no cervical adenopathy.  Neurological: She is alert. She has normal reflexes.  Skin: Skin is warm and dry. She is not diaphoretic.  Psychiatric: Mood and affect normal.  Nursing note and vitals reviewed.     Assessment & Plan  Problem List Items Addressed This Visit    None    Visit Diagnoses    Acute non-recurrent maxillary sinusitis    -  Primary   Relevant Medications   amoxicillin-clavulanate (AUGMENTIN) 875-125 MG tablet   Periumbilical abdominal pain       Relevant  Medications   amoxicillin-clavulanate (AUGMENTIN) 875-125 MG tablet   Other Relevant Orders   CBC with Differential/Platelet   Hepatic Function Panel (6)   Lipase      Meds ordered this encounter  Medications  . amoxicillin-clavulanate (AUGMENTIN) 875-125 MG tablet    Sig: Take 1 tablet by mouth 2 (two) times daily.    Dispense:  20 tablet    Refill:  0      Dr. Hayden Rasmusseneanna Castin Donaghue [facility]  08/14/16

## 2016-08-15 LAB — CBC WITH DIFFERENTIAL/PLATELET
Basophils Absolute: 0 10*3/uL (ref 0.0–0.2)
Basos: 0 %
EOS (ABSOLUTE): 0.1 10*3/uL (ref 0.0–0.4)
EOS: 1 %
Hematocrit: 45 % (ref 34.0–46.6)
Hemoglobin: 15.1 g/dL (ref 11.1–15.9)
IMMATURE GRANULOCYTES: 0 %
Immature Grans (Abs): 0 10*3/uL (ref 0.0–0.1)
Lymphocytes Absolute: 2.5 10*3/uL (ref 0.7–3.1)
Lymphs: 25 %
MCH: 29.7 pg (ref 26.6–33.0)
MCHC: 33.6 g/dL (ref 31.5–35.7)
MCV: 88 fL (ref 79–97)
Monocytes Absolute: 0.8 10*3/uL (ref 0.1–0.9)
Monocytes: 8 %
NEUTROS PCT: 66 %
Neutrophils Absolute: 6.5 10*3/uL (ref 1.4–7.0)
Platelets: 279 10*3/uL (ref 150–379)
RBC: 5.09 x10E6/uL (ref 3.77–5.28)
RDW: 13.9 % (ref 12.3–15.4)
WBC: 9.9 10*3/uL (ref 3.4–10.8)

## 2016-08-15 LAB — LIPASE: LIPASE: 24 U/L (ref 14–72)

## 2016-08-15 LAB — HEPATIC FUNCTION PANEL (6)
ALT: 14 IU/L (ref 0–32)
AST: 18 IU/L (ref 0–40)
Albumin: 4.4 g/dL (ref 3.5–5.5)
Alkaline Phosphatase: 51 IU/L (ref 39–117)
BILIRUBIN TOTAL: 0.5 mg/dL (ref 0.0–1.2)
BILIRUBIN, DIRECT: 0.16 mg/dL (ref 0.00–0.40)

## 2017-01-08 ENCOUNTER — Ambulatory Visit (INDEPENDENT_AMBULATORY_CARE_PROVIDER_SITE_OTHER): Payer: Medicaid Other | Admitting: Advanced Practice Midwife

## 2017-01-08 ENCOUNTER — Encounter: Payer: Self-pay | Admitting: Advanced Practice Midwife

## 2017-01-08 VITALS — BP 116/80 | Ht 66.0 in | Wt 142.0 lb

## 2017-01-08 DIAGNOSIS — Z0000 Encounter for general adult medical examination without abnormal findings: Secondary | ICD-10-CM | POA: Diagnosis not present

## 2017-01-08 DIAGNOSIS — Z124 Encounter for screening for malignant neoplasm of cervix: Secondary | ICD-10-CM

## 2017-01-08 DIAGNOSIS — L918 Other hypertrophic disorders of the skin: Secondary | ICD-10-CM

## 2017-01-08 DIAGNOSIS — Z01419 Encounter for gynecological examination (general) (routine) without abnormal findings: Secondary | ICD-10-CM

## 2017-01-08 DIAGNOSIS — Z113 Encounter for screening for infections with a predominantly sexual mode of transmission: Secondary | ICD-10-CM

## 2017-01-08 DIAGNOSIS — N760 Acute vaginitis: Secondary | ICD-10-CM

## 2017-01-08 DIAGNOSIS — B9689 Other specified bacterial agents as the cause of diseases classified elsewhere: Secondary | ICD-10-CM

## 2017-01-08 MED ORDER — METRONIDAZOLE 500 MG PO TABS: 500.0000 mg | ORAL_TABLET | Freq: Two times a day (BID) | ORAL | 0 refills | Status: AC

## 2017-01-08 NOTE — Progress Notes (Signed)
Patient ID: Doretha SouJosie Ann Elks, female   DOB: 11/11/1990, 26 y.o.   MRN: [id number]     Gynecology Annual Exam  PCP: Duanne LimerickJones, Deanna C, MD  Chief Complaint:  Chief Complaint  Patient presents with  . Annual Exam    History of Present Illness: Patient is a 26 y.o. [id number] presents for annual exam. The patient has complaints today of possible BV infection. She has noticed an odor and increase in discharge she describes as thick/white. She has not noticed irritation but has had some itching. She has a skin tag on her left areola that has been there for about 4 years and she is requesting that it be removed. She is considering switching from condoms for birth control to IUD.    LMP: Patient's last menstrual period was 12/26/2016. Average Interval: regular, 28 days Duration of flow: 4 days Heavy Menses: no Clots: no Intermenstrual Bleeding: no Postcoital Bleeding: no Dysmenorrhea: no  The patient is sexually active. She currently uses condoms for contraception. She denies dyspareunia.  The patient does not perform self breast exams.  There is no notable family history of breast or ovarian cancer in her family.  The patient wears seatbelts: yes.   The patient has regular exercise: yes.  The patient has a 10 year 1 PPD history of cigarette smoking. She is asked about readiness to quit and denies being ready.   The patient denies current symptoms of depression.    Review of Systems: Review of Systems  Constitutional: Negative.   HENT: Negative.   Eyes: Negative.   Respiratory: Negative.   Cardiovascular: Negative.   Gastrointestinal: Negative.   Genitourinary: Negative.   Musculoskeletal: Negative.   Skin: Negative.   Neurological: Negative.   Endo/Heme/Allergies: Negative.   Psychiatric/Behavioral: Negative.     Past Medical History:  Past Medical History:  Diagnosis Date  . Patient denies medical problems     Past Surgical History:  History reviewed. No pertinent surgical  history.  Gynecologic History:  Patient's last menstrual period was 12/26/2016. Contraception: condoms Last Pap: April 2017 Results were: no abnormalities   Obstetric History: [id number]  Family History:  History reviewed. No pertinent family history.  Social History:  Social History   Social History  . Marital status: Single    Spouse name: N/A  . Number of children: N/A  . Years of education: N/A   Occupational History  . Not on file.   Social History Main Topics  . Smoking status: Current Every Day Smoker    Packs/day: 1.00    Types: Cigarettes  . Smokeless tobacco: Never Used  . Alcohol use Yes     Comment: Occ.  . Drug use: No  . Sexual activity: Yes    Birth control/ protection: None   Other Topics Concern  . Not on file   Social History Narrative  . No narrative on file    Allergies:  Allergies  Allergen Reactions  . Zoloft [Sertraline Hcl] Swelling  . Sulfa Antibiotics     Medications: Prior to Admission medications   Medication Sig Start Date End Date Taking? Authorizing Provider  amoxicillin-clavulanate (AUGMENTIN) 875-125 MG tablet Take 1 tablet by mouth 2 (two) times daily. Patient not taking: Reported on 01/08/2017 08/14/16   Duanne LimerickJones, Deanna C, MD    Physical Exam Vitals: Blood pressure 116/80, height 5\' 6"  (1.676 m), weight 142 lb (64.4 kg), last menstrual period 12/26/2016.  General: NAD HEENT: normocephalic, anicteric Thyroid: no enlargement, no palpable nodules Pulmonary: No increased  work of breathing, CTAB Cardiovascular: RRR, distal pulses 2+ Breast: Breast symmetrical, no tenderness, no palpable nodules or masses, no skin or nipple retraction present, no nipple discharge.  No axillary or supraclavicular lymphadenopathy. Uncomplicated skin tag approximately 3mm on left areola Abdomen: NABS, soft, non-tender, non-distended.  Umbilicus without lesions.  No hepatomegaly, splenomegaly or masses palpable. No evidence of hernia   Genitourinary:  External: Normal external female genitalia.  Normal urethral meatus, normal  Bartholin's and Skene's glands.    Vagina: Normal vaginal mucosa, no evidence of prolapse.    Cervix: Grossly normal in appearance, no bleeding, no CMT  Uterus: Non-enlarged, mobile, normal contour.    Adnexa: ovaries non-enlarged, no adnexal masses  Rectal: deferred  Lymphatic: no evidence of inguinal lymphadenopathy Extremities: no edema, erythema, or tenderness Neurologic: Grossly intact Psychiatric: mood appropriate, affect full  Wet prep: positive for small amount of clue cells, pH 5, negative for yeast   Procedure: Skin tag on left areola removed: .5 ml 1% lidocaine injected at site of skin tag. Betadine swabs x2 applied. The skin tab was grasped using small kelly clamp and excised at base of tag with scissors. Silver nitrate applied for very small amount of bleeding. Hemostatic and patient tolerated procedure well.    Assessment: 26 y.o. [id number] Well Woman exam with PAP smear  Plan: Problem List Items Addressed This Visit    None    Visit Diagnoses    Well woman exam with routine gynecological exam    -  Primary   Relevant Orders   IGP,CtNgTv,rfx Aptima HPV ASCU   Cervical cancer screening       Relevant Orders   IGP,CtNgTv,rfx Aptima HPV ASCU   Skin tag       Relevant Orders   Pathology Report   Screen for sexually transmitted diseases       Relevant Orders   IGP,CtNgTv,rfx Aptima HPV ASCU   Bacterial vaginosis       Relevant Medications   metroNIDAZOLE (FLAGYL) 500 MG tablet      1) STI screening was offered and accepted  2) ASCCP guidelines and rational discussed.  Patient opts for yearly screening interval  3) Contraception - Education given regarding options for contraception, including pills, injection, LARCs. The patient is leaning towards IUD and will schedule appointment as needed.  4) Routine healthcare maintenance including cholesterol, diabetes screening  discussed Declines   5) Encouraged smoking cessation  6) S/P uncomplicated removal of skin tag on left areola  7) Follow up 1 year for routine annual exam   Tresea MallJane Kathlyn Leachman, CNM

## 2017-01-12 LAB — IGP,CTNGTV,RFX APTIMA HPV ASCU
CHLAMYDIA, NUC. ACID AMP: NEGATIVE
GONOCOCCUS, NUC. ACID AMP: NEGATIVE
PAP Smear Comment: 0
TRICH VAG BY NAA: NEGATIVE

## 2017-01-13 LAB — PATHOLOGY

## 2017-02-27 ENCOUNTER — Ambulatory Visit (INDEPENDENT_AMBULATORY_CARE_PROVIDER_SITE_OTHER): Payer: Medicaid Other | Admitting: Family Medicine

## 2017-02-27 ENCOUNTER — Encounter: Payer: Self-pay | Admitting: Family Medicine

## 2017-02-27 VITALS — BP 130/80 | HR 100 | Ht 66.0 in | Wt 148.0 lb

## 2017-02-27 DIAGNOSIS — J0100 Acute maxillary sinusitis, unspecified: Secondary | ICD-10-CM

## 2017-02-27 MED ORDER — AMOXICILLIN 500 MG PO CAPS: 500.0000 mg | ORAL_CAPSULE | Freq: Three times a day (TID) | ORAL | 0 refills | Status: DC

## 2017-02-27 NOTE — Progress Notes (Signed)
Name: Tanya Mccarthy   MRN: [id number]    DOB: 03-15-1991   Date:02/27/2017       Progress Note  Subjective  Chief Complaint  Chief Complaint  Patient presents with  . Sinusitis    cough and cong- runny nose/ green production    Sinusitis  This is a new problem. The current episode started in the past 7 days (tuesday). The problem has been gradually worsening since onset. There has been no fever. Her pain is at a severity of 5/10. The pain is moderate. Associated symptoms include congestion, coughing, headaches, sinus pressure and a sore throat. Pertinent negatives include no chills, diaphoresis, ear pain, neck pain or shortness of breath. Past treatments include acetaminophen. The treatment provided mild relief.    No problem-specific Assessment & Plan notes found for this encounter.   Past Medical History:  Diagnosis Date  . Patient denies medical problems     No past surgical history on file.  No family history on file.  Social History   Social History  . Marital status: Single    Spouse name: N/A  . Number of children: N/A  . Years of education: N/A   Occupational History  . Not on file.   Social History Main Topics  . Smoking status: Current Every Day Smoker    Packs/day: 1.00    Types: Cigarettes  . Smokeless tobacco: Never Used  . Alcohol use Yes     Comment: Occ.  . Drug use: No  . Sexual activity: Yes    Birth control/ protection: None   Other Topics Concern  . Not on file   Social History Narrative  . No narrative on file    Allergies  Allergen Reactions  . Zoloft [Sertraline Hcl] Swelling  . Sulfa Antibiotics     Outpatient Medications Prior to Visit  Medication Sig Dispense Refill  . amoxicillin-clavulanate (AUGMENTIN) 875-125 MG tablet Take 1 tablet by mouth 2 (two) times daily. (Patient not taking: Reported on 01/08/2017) 20 tablet 0   No facility-administered medications prior to visit.     Review of Systems  Constitutional: Negative  for chills, diaphoresis, fever, malaise/fatigue and weight loss.  HENT: Positive for congestion, sinus pressure and sore throat. Negative for ear discharge and ear pain.   Eyes: Negative for blurred vision.  Respiratory: Positive for cough. Negative for sputum production, shortness of breath and wheezing.   Cardiovascular: Negative for chest pain, palpitations and leg swelling.  Gastrointestinal: Negative for abdominal pain, blood in stool, constipation, diarrhea, heartburn, melena and nausea.  Genitourinary: Negative for dysuria, frequency, hematuria and urgency.  Musculoskeletal: Negative for back pain, joint pain, myalgias and neck pain.  Skin: Negative for rash.  Neurological: Positive for headaches. Negative for dizziness, tingling, sensory change and focal weakness.  Endo/Heme/Allergies: Negative for environmental allergies and polydipsia. Does not bruise/bleed easily.  Psychiatric/Behavioral: Negative for depression and suicidal ideas. The patient is not nervous/anxious and does not have insomnia.      Objective  Vitals:   02/27/17 1529  BP: 130/80  Pulse: 100  Weight: 148 lb (67.1 kg)  Height:  (1.676 m)    Physical Exam  Constitutional: She is well-developed, well-nourished, and in no distress. No distress.  HENT:  Head: Normocephalic and atraumatic.  Right Ear: External ear normal. Tympanic membrane is retracted.  Left Ear: External ear normal. Tympanic membrane is retracted.  Nose: Left sinus exhibits maxillary sinus tenderness.  Mouth/Throat: Oropharynx is clear and moist.  Eyes: Pupils are  equal, round, and reactive to light. Conjunctivae and EOM are normal. Right eye exhibits no discharge. Left eye exhibits no discharge.  Neck: Normal range of motion. Neck supple. No JVD present. No thyromegaly present.  Cardiovascular: Normal rate, regular rhythm, normal heart sounds and intact distal pulses.  Exam reveals no gallop and no friction rub.   No murmur  heard. Pulmonary/Chest: Effort normal and breath sounds normal. She has no wheezes. She has no rales. She exhibits no tenderness.  Abdominal: Soft. Bowel sounds are normal. She exhibits no mass. There is no tenderness. There is no guarding.  Musculoskeletal: Normal range of motion. She exhibits no edema.  Lymphadenopathy:    She has no cervical adenopathy.  Neurological: She is alert. She has normal reflexes.  Skin: Skin is warm and dry. She is not diaphoretic.  Psychiatric: Mood and affect normal.  Nursing note and vitals reviewed.     Assessment & Plan  Problem List Items Addressed This Visit    None    Visit Diagnoses    Acute maxillary sinusitis, recurrence not specified    -  Primary   Relevant Medications   amoxicillin (AMOXIL) 500 MG capsule      Meds ordered this encounter  Medications  . amoxicillin (AMOXIL) 500 MG capsule    Sig: Take 1 capsule (500 mg total) by mouth 3 (three) times daily.    Dispense:  30 capsule    Refill:  0      Dr. Elizabeth Sauer [facility]  02/27/17

## 2017-04-03 ENCOUNTER — Encounter: Payer: Self-pay | Admitting: *Deleted

## 2017-04-03 ENCOUNTER — Ambulatory Visit
Admission: EM | Admit: 2017-04-03 | Discharge: 2017-04-03 | Disposition: A | Discharge status: Home / Self Care | Payer: Medicaid Other | Attending: Family Medicine | Admitting: Family Medicine

## 2017-04-03 DIAGNOSIS — M545 Low back pain, unspecified: Secondary | ICD-10-CM

## 2017-04-03 MED ORDER — MELOXICAM 15 MG PO TABS: 15.0000 mg | ORAL_TABLET | Freq: Every day | ORAL | 0 refills | Status: DC | PRN

## 2017-04-03 MED ORDER — CYCLOBENZAPRINE HCL 10 MG PO TABS: 10.0000 mg | ORAL_TABLET | Freq: Two times a day (BID) | ORAL | 0 refills | Status: DC | PRN

## 2017-04-03 NOTE — ED Triage Notes (Signed)
Patient started having lower back pain yesterday. Mechanism of injury unknown.

## 2017-04-03 NOTE — Discharge Instructions (Signed)
Take medication as prescribed. Rest. Drink plenty of fluids.  ° °Follow up with your primary care physician this week as needed. Return to Urgent care for new or worsening concerns.  ° °

## 2017-04-03 NOTE — ED Provider Notes (Signed)
[facility] ____________________________________________  Time seen: Approximately 1215 PM  I have reviewed the triage vital signs and the nursing notes.   HISTORY  Chief Complaint Back Pain   HPI Tanya Mccarthy is a 26 y.o. female presenting for evaluation of low back pain that is been present since yesterday.  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  States yesterday she stated started to notice some low back pain that was mild and tolerable, patient reports last night her back pain increased.  Patient states that she is sitting completely still back pain is minimal if not resolved.  States pain is primarily with position changes especially with bending.  Patient reports that she fell asleep last night on her stomach and when she woke up this morning her back pain had increased as well.  Denies any fall.  Denies history of chronic back pain.  Patient again reports her job requires her to move around a lot, but denies any heavy lifting or injury.  Patient denies pain radiation, paresthesias, urinary bowel retention or incontinence.  Denies dysuria. Denies chest pain, shortness of breath, abdominal pain, dysuria, extremity pain, extremity swelling or rash. Denies recent sickness. Denies recent antibiotic use.    Past Medical History:  Diagnosis Date  . Patient denies medical problems     There are no active problems to display for this patient.   History reviewed. No pertinent surgical history.   No current facility-administered medications for this encounter.   Current Outpatient Prescriptions:  .  amoxicillin (AMOXIL) 500 MG capsule, Take 1 capsule (500 mg total) by mouth 3 (three) times daily., Disp: 30 capsule, Rfl: 0 .  cyclobenzaprine (FLEXERIL) 10 MG tablet, Take 1 tablet (10 mg total) by mouth 2 (two) times daily as needed for muscle spasms. Do not drive while taking as can cause drowsiness, Disp: 15  tablet, Rfl: 0 .  meloxicam (MOBIC) 15 MG tablet, Take 1 tablet (15 mg total) by mouth daily as needed., Disp: 10 tablet, Rfl: 0  Allergies Zoloft [sertraline hcl] and Sulfa antibiotics  History reviewed. No pertinent family history.  Social History Social History  Substance Use Topics  . Smoking status: Current Every Day Smoker    Packs/day: 1.00    Types: Cigarettes  . Smokeless tobacco: Never Used  . Alcohol use Yes     Comment: Occ.    Review of Systems Constitutional: No fever/chills Cardiovascular: Denies chest pain. Respiratory: Denies shortness of breath. Gastrointestinal: No abdominal pain.  No nausea, no vomiting.  Genitourinary: Negative for dysuria. Musculoskeletal: Positive for back pain. Skin: Negative for rash.   ____________________________________________   PHYSICAL EXAM:  VITAL SIGNS: ED Triage Vitals  Enc Vitals Group     BP 04/03/17 1117 123/81     Pulse Rate 04/03/17 1117 89     Resp 04/03/17 1117 16     Temp 04/03/17 1117 99 F (37.2 C)     Temp Source 04/03/17 1117 Oral     SpO2 04/03/17 1117 100 %     Weight 04/03/17 1119 145 lb (65.8 kg)     Height 04/03/17 1119 5\' 6"  (1.676 m)     Head Circumference --      Peak Flow --      Pain Score 04/03/17 1119 8     Pain Loc --      Pain Edu? --      Excl. in GC? --  Constitutional: Alert and oriented. Well appearing and in no acute distress. Cardiovascular: Normal rate, regular rhythm. Grossly normal heart sounds.  Good peripheral circulation. Respiratory: Normal respiratory effort without tachypnea nor retractions. Breath sounds are clear and equal bilaterally. No wheezes, rales, rhonchi. Gastrointestinal: Soft and nontender. No distention. No CVA tenderness. Musculoskeletal:  Nontender with normal range of motion in all extremities. No midline cervical or thoracic tenderness to palpation. Bilateral pedal pulses equal and easily palpated.  Mild mid and bilateral paralumbar tenderness to  palpation, no swelling, no ecchymosis, no rash, no pain with right or left rotation, pain present with flexion, mild pain present with extension, no pain with standing bilateral knee lifts, bilateral plantar flexion dorsiflexion strong and equal, no saddle anesthesia.  Changes positions quickly in room, steady gait. Neurologic:  Normal speech and language. No gross focal neurologic deficits are appreciated. Speech is normal. No gait instability.  Skin:  Skin is warm, dry and intact. No rash noted. Psychiatric: Mood and affect are normal. Speech and behavior are normal. Patient exhibits appropriate insight and judgment   ___________________________________________   LABS (all labs ordered are listed, but only abnormal results are displayed)  Labs Reviewed - No data to display ____________________________________________   PROCEDURES Procedures    INITIAL IMPRESSION / ASSESSMENT AND PLAN / ED COURSE  Pertinent labs & imaging results that were available during my care of the patient were reviewed by me and considered in my medical decision making (see chart for details).  Ovarian patient.  No acute distress.  Suspect strain injury.  Will treat patient with oral Mobic and as needed Flexeril.  Encouraged rest, fluids, stretching, avoidance of strenuous activity.  Work note given for today.Discussed indication, risks and benefits of medications with patient.  Discussed follow up with Primary care physician this week. Discussed follow up and return parameters including no resolution or any worsening concerns. Patient verbalized understanding and agreed to plan.   ____________________________________________   FINAL CLINICAL IMPRESSION(S) / ED DIAGNOSES  Final diagnoses:  Acute bilateral low back pain without sciatica     Discharge Medication List as of 04/03/2017 12:28 PM    START taking these medications   Details  cyclobenzaprine (FLEXERIL) 10 MG tablet Take 1 tablet (10 mg total)  by mouth 2 (two) times daily as needed for muscle spasms. Do not drive while taking as can cause drowsiness, Starting Fri 04/03/2017, Normal    meloxicam (MOBIC) 15 MG tablet Take 1 tablet (15 mg total) by mouth daily as needed., Starting Fri 04/03/2017, Normal        Note: This dictation was prepared with Dragon dictation along with smaller phrase technology. Any transcriptional errors that result from this process are unintentional.         Renford Dills, NP 04/03/17 1608

## 2017-06-04 ENCOUNTER — Other Ambulatory Visit: Payer: Self-pay | Admitting: Advanced Practice Midwife

## 2017-06-04 DIAGNOSIS — N760 Acute vaginitis: Principal | ICD-10-CM

## 2017-06-04 DIAGNOSIS — B9689 Other specified bacterial agents as the cause of diseases classified elsewhere: Secondary | ICD-10-CM

## 2017-12-29 ENCOUNTER — Encounter: Payer: Self-pay | Admitting: Maternal Newborn

## 2017-12-29 ENCOUNTER — Other Ambulatory Visit (HOSPITAL_COMMUNITY)
Admission: RE | Admit: 2017-12-29 | Discharge: 2017-12-29 | Disposition: A | Discharge status: Home / Self Care | Payer: Medicaid Other | Source: Ambulatory Visit | Attending: Maternal Newborn | Admitting: Maternal Newborn

## 2017-12-29 ENCOUNTER — Ambulatory Visit (INDEPENDENT_AMBULATORY_CARE_PROVIDER_SITE_OTHER): Payer: Medicaid Other | Admitting: Maternal Newborn

## 2017-12-29 VITALS — BP 100/60 | Wt 158.2 lb

## 2017-12-29 DIAGNOSIS — Z113 Encounter for screening for infections with a predominantly sexual mode of transmission: Secondary | ICD-10-CM

## 2017-12-29 DIAGNOSIS — Z0189 Encounter for other specified special examinations: Secondary | ICD-10-CM

## 2017-12-29 DIAGNOSIS — O99331 Smoking (tobacco) complicating pregnancy, first trimester: Secondary | ICD-10-CM

## 2017-12-29 DIAGNOSIS — O9989 Other specified diseases and conditions complicating pregnancy, childbirth and the puerperium: Secondary | ICD-10-CM

## 2017-12-29 DIAGNOSIS — F17210 Nicotine dependence, cigarettes, uncomplicated: Secondary | ICD-10-CM

## 2017-12-29 DIAGNOSIS — N644 Mastodynia: Secondary | ICD-10-CM

## 2017-12-29 DIAGNOSIS — Z3A09 9 weeks gestation of pregnancy: Secondary | ICD-10-CM

## 2017-12-29 DIAGNOSIS — Z3481 Encounter for supervision of other normal pregnancy, first trimester: Secondary | ICD-10-CM | POA: Diagnosis not present

## 2017-12-29 DIAGNOSIS — Z3480 Encounter for supervision of other normal pregnancy, unspecified trimester: Secondary | ICD-10-CM

## 2017-12-29 DIAGNOSIS — O99330 Smoking (tobacco) complicating pregnancy, unspecified trimester: Secondary | ICD-10-CM | POA: Insufficient documentation

## 2017-12-29 DIAGNOSIS — R110 Nausea: Secondary | ICD-10-CM

## 2017-12-29 DIAGNOSIS — R51 Headache: Secondary | ICD-10-CM

## 2017-12-29 NOTE — Progress Notes (Signed)
12/29/2017   Chief Complaint: Amenorrhea, positive home pregnancy test, desires prenatal care.  Transfer of Care Patient: no  History of Present Illness: Tanya Mccarthy is a 27 y.o. [id number] at 5954w1d based on Patient's last menstrual period on 10/26/2017 (approximate), with an Estimated Date of Delivery: 08/02/2018, with the above CC.   Her periods were: regular periods every 30 days, lasting 3-4 days. She was using no method when she conceived.  She has Positive signs or symptoms of nausea/vomiting of pregnancy. She has Negative signs or symptoms of miscarriage or preterm labor She identifies Negative Zika risk factors for her and her partner On any different medications around the time she conceived/early pregnancy: No  History of varicella: Yes    Review of Systems  Constitutional: Positive for appetite change and fatigue.  HENT: Negative.   Eyes: Negative.   Respiratory: Negative for cough, shortness of breath and wheezing.   Cardiovascular: Negative for chest pain and palpitations.  Gastrointestinal: Positive for nausea.  Endocrine: Negative.   Genitourinary: Positive for frequency.  Musculoskeletal: Negative.   Skin: Negative.   Allergic/Immunologic: Negative.   Neurological: Positive for headaches.  Hematological: Negative.   Psychiatric/Behavioral: Negative for dysphoric mood. The patient is not nervous/anxious.   Breast ROS: positive for - breast tenderness  Review of systems was otherwise negative, except as stated in the above HPI.  OBGYN History: As per HPI. OB History  Gravida Para Term Preterm AB Living  4 2 2   1 2   SAB TAB Ectopic Multiple Live Births          2    # Outcome Date GA Lbr Len/2nd Weight Sex Delivery Anes PTL Lv  4 Current           3 Term 10/18/12   8 lb 6 oz (3.799 kg) M Vag-Spont   LIV  2 Term 09/23/10   8 lb 5 oz (3.771 kg) M Vag-Spont   LIV  1 AB             Any issues with any prior pregnancies: no Any prior children are healthy, doing  well, without any problems or issues: yes History of pap smears: Yes. Last pap smear 01/08/2017. NILM. History of STIs: Yes, HPV, several years ago, has had normal Paps since then.   Past Medical History: Past Medical History:  Diagnosis Date  . Abnormal Pap smear of cervix 2015   +HPV  . History of Papanicolaou smear of cervix 03/03/14; 10/02/15   ASCUS/+HPV; NEG, CT/GC/TR NEG;  . Patient denies medical problems     Past Surgical History: Past Surgical History:  Procedure Laterality Date  . NO PAST SURGERIES    . WISDOM TOOTH EXTRACTION  2012   ONE    Family History:  Family History  Problem Relation Age of Onset  . Cancer Mother 20       CERVIX, OVARY  . Diabetes Maternal Grandmother   . Cancer Maternal Grandfather [id number]       KIDNEY, LIVER, STOMACH  . Cancer Paternal Grandfather        COLON   She denies any female cancers, bleeding or blood clotting disorders.  She reports that the father of the baby has a daughter with Katrinka BlazingSmith- Magenis syndrome. She denies any other history of intellectual disability, birth defects or genetic disorders in her or the FOB's history  Social History:  Social History   Socioeconomic History  . Marital status: Single    Spouse name: Not on file  .  Number of children: 2  . Years of education: 59  . Highest education level: Not on file  Occupational History  . Occupation: UNEMPLOYED  Social Needs  . Financial resource strain: Not on file  . Food insecurity:    Worry: Not on file    Inability: Not on file  . Transportation needs:    Medical: Not on file    Non-medical: Not on file  Tobacco Use  . Smoking status: Current Every Day Smoker    Packs/day: 1.00    Types: Cigarettes  . Smokeless tobacco: Never Used  Substance and Sexual Activity  . Alcohol use: Not Currently    Comment: Occ.  . Drug use: No  . Sexual activity: Yes    Birth control/protection: None  Lifestyle  . Physical activity:    Days per week: Not on file     Minutes per session: Not on file  . Stress: Not on file  Relationships  . Social connections:    Talks on phone: Not on file    Gets together: Not on file    Attends religious service: Not on file    Active member of club or organization: Not on file    Attends meetings of clubs or organizations: Not on file    Relationship status: Not on file  . Intimate partner violence:    Fear of current or ex partner: Not on file    Emotionally abused: Not on file    Physically abused: Not on file    Forced sexual activity: Not on file  Other Topics Concern  . Not on file  Social History Narrative  . Not on file   Any cats in the household: no Denies history of and current domestic violence.  Allergy: Allergies  Allergen Reactions  . Zoloft [Sertraline Hcl] Swelling  . Sulfa Antibiotics     Current Outpatient Medications: No current outpatient medications on file.   Physical Exam:  BP 100/60   Wt 158 lb 4 oz (71.8 kg)   LMP 10/26/2017 (Approximate)   BMI 25.54 kg/m    Constitutional: Well nourished, well developed female in no acute distress.  Neck:  Supple, normal appearance, and no thyromegaly  Cardiovascular: S1, S2 normal, no murmur, rub or gallop, regular rate and rhythm Respiratory:  Clear to auscultation bilaterally. Normal respiratory effort Abdomen: no masses, hernias; diffusely non tender to palpation, non distended Breasts: breasts appear normal, no suspicious masses, no skin or nipple changes or axillary nodes. Neuro/Psych:  Normal mood and affect.  Skin:  Warm and dry.  Lymphatic:  No inguinal lymphadenopathy.   Pelvic exam: is not limited by body habitus External genitalia, Bartholin's glands, Urethra, Skene's glands: within normal limits Vagina: within normal limits and with no blood in the vault  Cervix: speculum exam declined after shared decision-maiking Uterus:  enlarged: consistent with early pregnancy Adnexa:  no mass, fullness,  tenderness  Assessment: Tanya Mccarthy is a 27 y.o. [id number] at [redacted]w[redacted]d based on Patient's last menstrual period on 10/26/2017 (approximate), with an Estimated Date of Delivery: 08/02/2018, presenting for prenatal care.  Plan:  1) Avoid alcoholic beverages. 2) Patient encouraged not to smoke. She is currently smoking 1 ppd, and this is a decrease in the amount of cigarettes that she was smoking before pregnancy. 3) Discontinue the use of all non-medicinal drugs and chemicals.  4) Take prenatal vitamins daily.  5) Seatbelt use advised 6) Nutrition, food safety (fish, cheese advisories, and high nitrite foods) and exercise discussed. 7)  Hospital and practice style delivering at [facility] discussed  8) Patient is asked about travel to areas at risk for the Zika virus, and counseled to avoid travel and exposure to mosquitoes or sexual partners who may have themselves been exposed to the virus. Testing is discussed, and will be ordered as appropriate.  9) Childbirth classes at [facility] advised 10) Genetic Screening, such as with 1st Trimester Screening, cell free fetal DNA, AFP testing, and Ultrasound, as well as with amniocentesis and CVS as appropriate, is discussed with patient. She is undecided about genetic testing this pregnancy. 11) Dating and viability ultrasound ordered. 12) Samples of Bonjesta given, patient will call for Rx if effective.  Problem list reviewed and updated.  Return in about 1 day (around 12/30/2017) for ROB with ultrasound.  Marcelyn Bruins, CNM [facility], [facility] 12/29/2017  10:35 AM

## 2017-12-29 NOTE — Progress Notes (Signed)
No concerns.rj 

## 2017-12-30 LAB — RPR+RH+ABO+RUB AB+AB SCR+CB...
ANTIBODY SCREEN: NEGATIVE
HEMATOCRIT: 43.1 % (ref 34.0–46.6)
HIV Screen 4th Generation wRfx: NONREACTIVE
Hemoglobin: 14.2 g/dL (ref 11.1–15.9)
Hepatitis B Surface Ag: NEGATIVE
MCH: 30.4 pg (ref 26.6–33.0)
MCHC: 32.9 g/dL (ref 31.5–35.7)
MCV: 92 fL (ref 79–97)
Platelets: 297 10*3/uL (ref 150–450)
RBC: 4.67 x10E6/uL (ref 3.77–5.28)
RDW: 13.4 % (ref 12.3–15.4)
RPR: NONREACTIVE
RUBELLA: 1.41 {index} (ref >0.99)
Rh Factor: POSITIVE
Varicella zoster IgG: 829 index (ref >165)
WBC: 11.4 10*3/uL — ABNORMAL HIGH (ref 3.4–10.8)

## 2017-12-30 LAB — URINE DRUG PANEL 7
Amphetamines, Urine: NEGATIVE ng/mL
Barbiturate Quant, Ur: NEGATIVE ng/mL
Benzodiazepine Quant, Ur: NEGATIVE ng/mL
CANNABINOID QUANT UR: NEGATIVE ng/mL
Cocaine (Metab.): NEGATIVE ng/mL
Opiate Quant, Ur: NEGATIVE ng/mL
PCP Quant, Ur: NEGATIVE ng/mL

## 2017-12-31 LAB — URINE CULTURE

## 2017-12-31 LAB — CERVICOVAGINAL ANCILLARY ONLY
Chlamydia: NEGATIVE
NEISSERIA GONORRHEA: NEGATIVE

## 2018-01-06 ENCOUNTER — Ambulatory Visit (INDEPENDENT_AMBULATORY_CARE_PROVIDER_SITE_OTHER): Payer: Self-pay

## 2018-01-06 ENCOUNTER — Encounter: Payer: Self-pay | Admitting: Advanced Practice Midwife

## 2018-01-06 ENCOUNTER — Ambulatory Visit (INDEPENDENT_AMBULATORY_CARE_PROVIDER_SITE_OTHER): Payer: Medicaid Other | Admitting: Advanced Practice Midwife

## 2018-01-06 VITALS — BP 106/70 | Wt 159.0 lb

## 2018-01-06 DIAGNOSIS — Z3A08 8 weeks gestation of pregnancy: Secondary | ICD-10-CM

## 2018-01-06 DIAGNOSIS — O219 Vomiting of pregnancy, unspecified: Secondary | ICD-10-CM

## 2018-01-06 DIAGNOSIS — Z3480 Encounter for supervision of other normal pregnancy, unspecified trimester: Secondary | ICD-10-CM

## 2018-01-06 DIAGNOSIS — O3680X0 Pregnancy with inconclusive fetal viability, not applicable or unspecified: Secondary | ICD-10-CM

## 2018-01-06 DIAGNOSIS — Z0189 Encounter for other specified special examinations: Secondary | ICD-10-CM

## 2018-01-06 MED ORDER — DOXYLAMINE-PYRIDOXINE 10-10 MG PO TBEC: DELAYED_RELEASE_TABLET | ORAL | 1 refills | Status: DC

## 2018-01-06 NOTE — Progress Notes (Signed)
  Routine Prenatal Care Visit  Subjective  Tanya Mccarthy is a 27 y.o. [phone] at 3071w0d being seen today for ongoing prenatal care.  She is currently monitored for the following issues for this low-risk pregnancy and has Supervision of other normal pregnancy, antepartum and Tobacco use in pregnancy, antepartum on their problem list.  ----------------------------------------------------------------------------------- Patient reports bonjesta sample is helping decrease her nausea. She requests Rx- will write for diclegis based on pt insurance.    . Vag. Bleeding: None.   . Denies leaking of fluid.  ----------------------------------------------------------------------------------- The following portions of the patient's history were reviewed and updated as appropriate: allergies, current medications, past family history, past medical history, past social history, past surgical history and problem list. Problem list updated.   Objective  Blood pressure 106/70, weight 159 lb (72.1 kg), last menstrual period 10/26/2017. Pregravid weight 151 lb (68.5 kg) Total Weight Gain 8 lb (3.629 kg) Urinalysis: Urine Protein: Negative Urine Glucose: Negative  Fetal Status: Fetal Heart Rate (bpm): 152         Dating ultrasound today: 5071w0d with new EDD of 08/18/2018  General:  Alert, oriented and cooperative. Patient is in no acute distress.  Skin: Skin is warm and dry. No rash noted.   Cardiovascular: Normal heart rate noted  Respiratory: Normal respiratory effort, no problems with respiration noted  Abdomen: Soft, gravid, appropriate for gestational age.       Pelvic:  Cervical exam deferred        Extremities: Normal range of motion.     Mental Status: Normal mood and affect. Normal behavior. Normal judgment and thought content.   Assessment   27 y.o. [id number] at 2071w0d by  08/18/2018, by Ultrasound presenting for routine prenatal visit  Plan   FOURTH Problems (from 12/29/17 to present)    Problem Noted  Resolved   Supervision of other normal pregnancy, antepartum 12/29/2017 by Oswaldo ConroySchmid, Jacelyn Y, CNM No   Overview Addendum 12/30/2017  8:53 AM by Oswaldo ConroySchmid, Jacelyn Y, CNM    Clinic [facility]  Dating  Blood type: A/Positive/-- (07/23 1102)   Genetic Screen 1 Screen:    AFP:     Quad:     NIPS: Antibody:Negative (07/23 1102)  Anatomic US  Rubella: 1.41 (07/23 1102) Varicella: Immune  GTT Early:               Third trimester:  RPR: Non Reactive (07/23 1102)   Rhogam  HBsAg: Negative (07/23 1102)   TDaP vaccine                       Flu Shot: HIV: Non Reactive (07/23 1102)   Baby Food                                GBS:   Contraception  Pap: 01/08/2017, NILM  CBB     CS/VBAC    Support Person                  Preterm labor symptoms and general obstetric precautions including but not limited to vaginal bleeding, contractions, leaking of fluid and fetal movement were reviewed in detail with the patient.   Return in about 2 weeks (around 01/20/2018) for rob.  Tresea MallJane Taz Vanness, CNM 01/06/2018 11:24 AM

## 2018-01-25 ENCOUNTER — Other Ambulatory Visit: Payer: Self-pay | Admitting: Certified Nurse Midwife

## 2018-01-25 ENCOUNTER — Ambulatory Visit (INDEPENDENT_AMBULATORY_CARE_PROVIDER_SITE_OTHER): Payer: Medicaid Other | Admitting: Certified Nurse Midwife

## 2018-01-25 VITALS — BP 110/60 | Wt 156.0 lb

## 2018-01-25 DIAGNOSIS — Z1379 Encounter for other screening for genetic and chromosomal anomalies: Secondary | ICD-10-CM

## 2018-01-25 DIAGNOSIS — Z3A10 10 weeks gestation of pregnancy: Secondary | ICD-10-CM

## 2018-01-25 LAB — POCT URINALYSIS DIPSTICK OB
GLUCOSE, UA: NEGATIVE — AB
POC,PROTEIN,UA: NEGATIVE

## 2018-01-25 NOTE — Progress Notes (Signed)
C/o still dealing c morning sickness, rx was over $300 so she didn't get it filled.

## 2018-01-25 NOTE — Progress Notes (Signed)
ROB at 10wk5d: Has not changed Medicaid from Family Planning to Pregnancy Medicaid, so unable to get Bonjesta filled. Given more samples of Bonjesta for nausea and vomiting. Has lost 3# over the last 3 weeks. Has vomited maybe 3 times in 2 weeks.  Exam: FH at [facility] +3-4 FB. FHT with DT=160 Desires Materniti 21 today ROB 4 weeks  Farrel Connersolleen Fallon Howerter, CNM

## 2018-01-31 LAB — MATERNIT 21 PLUS CORE, BLOOD
Chromosome 13: NEGATIVE
Chromosome 18: NEGATIVE
Chromosome 21: NEGATIVE
Y Chromosome: DETECTED

## 2018-02-05 ENCOUNTER — Telehealth: Payer: Self-pay

## 2018-02-05 NOTE — Telephone Encounter (Signed)
[facility] calling for ICD-10 codes for DOS 8/19, ref# [id number].  Called and left detailed msg of codes used for that DOS.

## 2018-02-18 ENCOUNTER — Telehealth: Payer: Self-pay

## 2018-02-18 NOTE — Telephone Encounter (Signed)
Pt is switching Medicaid to Pregnancy Medicaid and needs proof of preg letter faxed to [phone].   Faxed.

## 2018-02-22 ENCOUNTER — Encounter: Payer: Medicaid Other | Admitting: Obstetrics and Gynecology

## 2018-03-02 ENCOUNTER — Ambulatory Visit (INDEPENDENT_AMBULATORY_CARE_PROVIDER_SITE_OTHER): Payer: Medicaid Other | Admitting: Obstetrics and Gynecology

## 2018-03-02 ENCOUNTER — Encounter: Payer: Self-pay | Admitting: Obstetrics and Gynecology

## 2018-03-02 VITALS — BP 118/74 | Wt 158.0 lb

## 2018-03-02 DIAGNOSIS — O99332 Smoking (tobacco) complicating pregnancy, second trimester: Secondary | ICD-10-CM

## 2018-03-02 DIAGNOSIS — F17210 Nicotine dependence, cigarettes, uncomplicated: Secondary | ICD-10-CM

## 2018-03-02 DIAGNOSIS — Z3A15 15 weeks gestation of pregnancy: Secondary | ICD-10-CM

## 2018-03-02 DIAGNOSIS — Z3480 Encounter for supervision of other normal pregnancy, unspecified trimester: Secondary | ICD-10-CM

## 2018-03-02 DIAGNOSIS — O99330 Smoking (tobacco) complicating pregnancy, unspecified trimester: Secondary | ICD-10-CM

## 2018-03-02 NOTE — Progress Notes (Signed)
  Routine Prenatal Care Visit  Subjective  Tanya Mccarthy is a 27 y.o. [phone] at 6141w6d being seen today for ongoing prenatal care.  She is currently monitored for the following issues for this low-risk pregnancy and has Supervision of other normal pregnancy, antepartum and Tobacco use in pregnancy, antepartum on their problem list.  ----------------------------------------------------------------------------------- Patient reports no complaints.    . Vag. Bleeding: None.  Movement: Present. Denies leaking of fluid.  ----------------------------------------------------------------------------------- The following portions of the patient's history were reviewed and updated as appropriate: allergies, current medications, past family history, past medical history, past social history, past surgical history and problem list. Problem list updated.   Objective  Blood pressure 118/74, weight 158 lb (71.7 kg), last menstrual period 10/26/2017. Pregravid weight 151 lb (68.5 kg) Total Weight Gain 7 lb (3.175 kg) Urinalysis: Urine Protein    Urine Glucose    Fetal Status: Fetal Heart Rate (bpm): 145   Movement: Present     General:  Alert, oriented and cooperative. Patient is in no acute distress.  Skin: Skin is warm and dry. No rash noted.   Cardiovascular: Normal heart rate noted  Respiratory: Normal respiratory effort, no problems with respiration noted  Abdomen: Soft, gravid, appropriate for gestational age.       Pelvic:  Cervical exam deferred        Extremities: Normal range of motion.     Mental Status: Normal mood and affect. Normal behavior. Normal judgment and thought content.   Assessment   27 y.o. [id number] at 541w6d by  08/18/2018, by Ultrasound presenting for routine prenatal visit  Plan   FOURTH Problems (from 12/29/17 to present)    Problem Noted Resolved   Supervision of other normal pregnancy, antepartum 12/29/2017 by Oswaldo ConroySchmid, Jacelyn Y, CNM No   Overview Addendum 02/01/2018   2:02 PM by Farrel ConnersGutierrez, Colleen, CNM    Clinic [facility]  Dating  Blood type: A/Positive/-- (07/23 1102)   Genetic Screen 1 Screen:    AFP:     Quad:     NIPS: diploid XY Antibody:Negative (07/23 1102)  Anatomic US  Rubella: 1.41 (07/23 1102) Varicella: Immune  GTT Early:               Third trimester:  RPR: Non Reactive (07/23 1102)   Rhogam  HBsAg: Negative (07/23 1102)   TDaP vaccine                       Flu Shot: HIV: Non Reactive (07/23 1102)   Baby Food                                GBS:   Contraception  Pap: 01/08/2017, NILM  CBB     CS/VBAC    Support Person                 Preterm labor symptoms and general obstetric precautions including but not limited to vaginal bleeding, contractions, leaking of fluid and fetal movement were reviewed in detail with the patient. Please refer to After Visit Summary for other counseling recommendations.   Return in about 4 weeks (around 03/30/2018) for u/s for anatomy and routine prenatal.  Thomasene MohairStephen Naoma Boxell, MD, Merlinda FrederickFACOG [facility], [facility] 03/02/2018 2:34 PM

## 2018-03-18 ENCOUNTER — Telehealth: Payer: Self-pay

## 2018-03-18 NOTE — Telephone Encounter (Signed)
Pt states she had labs done at 11 wks & received results but is wondering what the fetal fraction is. She doesn't have an appointment for 3 more weeks. MV#[phone]

## 2018-03-18 NOTE — Telephone Encounter (Signed)
LMVM to notify 10% is the answer to her requested information. Pt advised to return call with any further questions/concerns.

## 2018-03-18 NOTE — Telephone Encounter (Signed)
Patient is wanting to have a returned call please advise

## 2018-04-01 NOTE — Telephone Encounter (Signed)
Spoke w/pt. She didn't receive the previous vm that was left w/requested results. Notified Fetal Fraction 10%. Pt also wanted to verify it is a female fetus. Verified female fetus per MaterniT21 results report.

## 2018-04-01 NOTE — Telephone Encounter (Signed)
Pt calling about details of her NIPT testing. XL#[phone]

## 2018-04-05 ENCOUNTER — Ambulatory Visit (INDEPENDENT_AMBULATORY_CARE_PROVIDER_SITE_OTHER): Payer: Self-pay

## 2018-04-05 ENCOUNTER — Ambulatory Visit (INDEPENDENT_AMBULATORY_CARE_PROVIDER_SITE_OTHER): Payer: Medicaid Other | Admitting: Obstetrics & Gynecology

## 2018-04-05 VITALS — BP 100/60 | Wt 159.0 lb

## 2018-04-05 DIAGNOSIS — Z363 Encounter for antenatal screening for malformations: Secondary | ICD-10-CM

## 2018-04-05 DIAGNOSIS — Z3480 Encounter for supervision of other normal pregnancy, unspecified trimester: Secondary | ICD-10-CM

## 2018-04-05 DIAGNOSIS — Z3A20 20 weeks gestation of pregnancy: Secondary | ICD-10-CM

## 2018-04-05 DIAGNOSIS — Z23 Encounter for immunization: Secondary | ICD-10-CM

## 2018-04-05 DIAGNOSIS — O99332 Smoking (tobacco) complicating pregnancy, second trimester: Secondary | ICD-10-CM

## 2018-04-05 DIAGNOSIS — F17210 Nicotine dependence, cigarettes, uncomplicated: Secondary | ICD-10-CM

## 2018-04-05 DIAGNOSIS — O99330 Smoking (tobacco) complicating pregnancy, unspecified trimester: Secondary | ICD-10-CM

## 2018-04-05 LAB — POCT URINALYSIS DIPSTICK OB
Glucose, UA: NEGATIVE
PROTEIN: NEGATIVE

## 2018-04-05 NOTE — Patient Instructions (Signed)

## 2018-04-05 NOTE — Progress Notes (Signed)
HPI: Occas groin pain.  Improved nausea.  No bleeding.  Pt trying to cut back on smoking.  Ultrasound demonstrates see below, normal anatomy OB US. These findings are Pelvis normal  PMHx: She  has a past medical history of Abnormal Pap smear of cervix (2015), History of Papanicolaou smear of cervix (03/03/14; 10/02/15), and Patient denies medical problems. Also,  has a past surgical history that includes No past surgeries and Wisdom tooth extraction (2012)., family history includes Cancer in her paternal grandfather; Cancer (age of onset: 5) in her mother; Cancer (age of onset: 60) in her maternal grandfather; Diabetes in her maternal grandmother.,  reports that she has been smoking cigarettes. She has been smoking about 1.00 pack per day. She has never used smokeless tobacco. She reports that she drank alcohol. She reports that she does not use drugs.  She has a current medication list which includes the following prescription(s): doxylamine-pyridoxine. Also, is allergic to zoloft [sertraline hcl] and sulfa antibiotics.  Review of Systems  All other systems reviewed and are negative.   Objective: BP 100/60   Wt 159 lb (72.1 kg)   LMP 10/26/2017 (Approximate)   BMI 25.66 kg/m   Physical examination Constitutional NAD, Conversant  Skin No rashes, lesions or ulceration.   Extremities: Moves all appropriately.  Normal ROM for age. No lymphadenopathy.  Neuro: Grossly intact  Psych: Oriented to PPT.  Normal mood. Normal affect.   US Ob Comp + 14 Wk  Result Date: 04/05/2018 Patient Name: Tanya Mccarthy DOB: [birth year] MRN: [id number] ULTRASOUND REPORT Location: [facility] Date of Service: 04/05/2018 Indications:Anatomy Ultrasound Findings: Mason Jim intrauterine pregnancy is visualized with FHR at 135 BPM. Biometrics give an (U/S) Gestational age of [redacted]w[redacted]d and an (U/S) EDD of 08/13/2018; this correlates with the clinically established Estimated Date of Delivery: 08/18/18 Fetal presentation  is Variable. EFW: 427g (15oz). Placenta: posterior. Grade: 0. Uterine adhesion seen. AFI: subjectively normal. Anatomic survey is complete and normal; Gender - female.  Right Ovary is normal in appearance. Left Ovary is normal appearance. Survey of the adnexa demonstrates no adnexal masses. There is no free peritoneal fluid in the cul de sac. Impression: 1. [redacted]w[redacted]d Viable Singleton Intrauterine pregnancy by U/S. 2. (U/S) EDD is consistent with Clinically established Estimated Date of Delivery: 08/18/18 . 3. Normal Anatomy Scan Recommendations: 1.Clinical correlation with the patient's History and Physical Exam. Darlina Guys, RDMS RVT Review of ULTRASOUND. I have personally reviewed images and report of recent ultrasound done at [facility]. There is a singleton gestation with subjectively normal amniotic fluid volume. The fetal biometry correlates with established dating. Detailed evaluation of the fetal anatomy was performed.The fetal anatomical survey appears within normal limits within the resolution of ultrasound as described above.  It must be noted that a normal ultrasound is unable to rule out fetal aneuploidy.  Annamarie Major, MD, FACOG [facility], [facility] 04/05/2018  9:46 AM    Assessment:  [redacted] weeks gestation of pregnancy  Supervision of other normal pregnancy, antepartum  Tobacco use in pregnancy, antepartum     Cont to encourage cessation and offered measures to help  Flu shot today  Review of ULTRASOUND. I have personally reviewed images and report of recent ultrasound done at [facility]. There is a singleton gestation with subjectively normal amniotic fluid volume. The fetal biometry correlates with established dating. Detailed evaluation of the fetal anatomy was performed.The fetal anatomical survey appears within normal limits within the resolution of ultrasound as described above.  It must  be noted that a normal ultrasound is unable to rule out fetal aneuploidy.    Annamarie Major, MD, [facility], [facility] 04/05/2018  9:56 AM

## 2018-04-05 NOTE — Addendum Note (Signed)
Addended by: Cornelius Moras D on: 04/05/2018 10:07 AM   Modules accepted: Orders

## 2018-05-03 ENCOUNTER — Ambulatory Visit (INDEPENDENT_AMBULATORY_CARE_PROVIDER_SITE_OTHER): Payer: Medicaid Other | Admitting: Maternal Newborn

## 2018-05-03 ENCOUNTER — Encounter: Payer: Self-pay | Admitting: Maternal Newborn

## 2018-05-03 VITALS — BP 116/74 | Wt 163.0 lb

## 2018-05-03 DIAGNOSIS — Z3480 Encounter for supervision of other normal pregnancy, unspecified trimester: Secondary | ICD-10-CM

## 2018-05-03 DIAGNOSIS — Z3A24 24 weeks gestation of pregnancy: Secondary | ICD-10-CM

## 2018-05-03 DIAGNOSIS — Z3482 Encounter for supervision of other normal pregnancy, second trimester: Secondary | ICD-10-CM

## 2018-05-03 LAB — POCT URINALYSIS DIPSTICK OB
Glucose, UA: NEGATIVE
POC,PROTEIN,UA: NEGATIVE

## 2018-05-03 NOTE — Progress Notes (Signed)
    Routine Prenatal Care Visit  Subjective  Tanya Mccarthy is a 27 Mccarthy.o. [phone] at 5957w5d being seen today for ongoing prenatal care.  She is currently monitored for the following issues for this low-risk pregnancy and has Supervision of other normal pregnancy, antepartum and Tobacco use in pregnancy, antepartum on their problem list.  ----------------------------------------------------------------------------------- Patient reports URI symptoms: cough, nasal congestion, sore throat, some clear fluid drainage from her ear.   Contractions: Not present. Vag. Bleeding: None.  Movement: Present. No leaking of fluid.  ----------------------------------------------------------------------------------- The following portions of the patient's history were reviewed and updated as appropriate: allergies, current medications, past family history, past medical history, past social history, past surgical history and problem list. Problem list updated.  Objective  Blood pressure 116/74, weight 163 lb (73.9 kg), last menstrual period 10/26/2017. Pregravid weight 151 lb (68.5 kg) Total Weight Gain 12 lb (5.443 kg)   Urinalysis: Protein Negative, Glucose Negative  Fetal Status: Fetal Heart Rate (bpm): 143 Fundal Height: 24 cm Movement: Present     General:  Alert, oriented and cooperative. Patient is in no acute distress.  Skin: Skin is warm and dry. No rash noted.   Cardiovascular: Normal heart rate noted  Respiratory: Normal respiratory effort, no problems with respiration noted  Abdomen: Soft, gravid, appropriate for gestational age. Pain/Pressure: Absent     Pelvic:  Cervical exam deferred        Extremities: Normal range of motion.     Mental Status: Normal mood and affect. Normal behavior. Normal judgment and thought content.     Assessment   27 Mccarthy.o. [id number] at 557w5d, EDD 08/18/2018 by Ultrasound presenting for a routine prenatal visit.  Plan   FOURTH Problems (from 12/29/17 to present)    Problem Noted Resolved   Supervision of other normal pregnancy, antepartum 12/29/2017 by Oswaldo ConroySchmid, Tanya Mccarthy, CNM No   Overview Addendum 02/01/2018  2:02 PM by Farrel ConnersGutierrez, Tanya Mccarthy, CNM    Clinic [facility] Prenatal Labs  Dating  Blood type: A/Positive/-- (07/23 1102)   Genetic Screen 1 Screen:    AFP:     Quad:     NIPS: diploid XY Antibody:Negative (07/23 1102)  Anatomic US  Rubella: 1.41 (07/23 1102) Varicella: Immune  GTT Early:               Third trimester:  RPR: Non Reactive (07/23 1102)   Rhogam  HBsAg: Negative (07/23 1102)   TDaP vaccine                       Flu Shot: HIV: Non Reactive (07/23 1102)   Baby Food                                GBS:   Contraception  Pap: 01/08/2017, NILM  CBB     CS/VBAC    Support Person               Discussed OTC medications for URI, when to return to care with worsening symptoms.  Return in about 4 weeks (around 05/31/2018) for ROB with GTT/28 week labs.  Tanya Mccarthy, CNM 05/03/2018  9:58 AM

## 2018-05-03 NOTE — Progress Notes (Signed)
ROB Ear pain/possible ear drum busted

## 2018-06-07 ENCOUNTER — Ambulatory Visit (INDEPENDENT_AMBULATORY_CARE_PROVIDER_SITE_OTHER): Payer: Medicaid Other | Admitting: Obstetrics and Gynecology

## 2018-06-07 ENCOUNTER — Encounter: Payer: Self-pay | Admitting: Obstetrics and Gynecology

## 2018-06-07 ENCOUNTER — Other Ambulatory Visit: Payer: Medicaid Other

## 2018-06-07 VITALS — BP 112/60 | Wt 170.0 lb

## 2018-06-07 DIAGNOSIS — O9989 Other specified diseases and conditions complicating pregnancy, childbirth and the puerperium: Secondary | ICD-10-CM

## 2018-06-07 DIAGNOSIS — Z3480 Encounter for supervision of other normal pregnancy, unspecified trimester: Secondary | ICD-10-CM

## 2018-06-07 DIAGNOSIS — O99330 Smoking (tobacco) complicating pregnancy, unspecified trimester: Secondary | ICD-10-CM

## 2018-06-07 DIAGNOSIS — M25551 Pain in right hip: Secondary | ICD-10-CM

## 2018-06-07 DIAGNOSIS — O99333 Smoking (tobacco) complicating pregnancy, third trimester: Secondary | ICD-10-CM

## 2018-06-07 DIAGNOSIS — M25552 Pain in left hip: Secondary | ICD-10-CM

## 2018-06-07 DIAGNOSIS — Z3A29 29 weeks gestation of pregnancy: Secondary | ICD-10-CM

## 2018-06-07 LAB — POCT URINALYSIS DIPSTICK OB
Glucose, UA: NEGATIVE
POC,PROTEIN,UA: NEGATIVE

## 2018-06-07 NOTE — Progress Notes (Signed)
Routine Prenatal Care Visit  Subjective  Tanya Mccarthy is a 27 y.o. [phone] at 3752w5d being seen today for ongoing prenatal care.  She is currently monitored for the following issues for this low-risk pregnancy and has Supervision of other normal pregnancy, antepartum and Tobacco use in pregnancy, antepartum on their problem list.  ----------------------------------------------------------------------------------- Patient reports hip pain. Pain radiating down medial thighs bilaterally. Worse after long periods of walking or standing. Difficulty with putting on pants in the morning.  Contractions: Not present. Vag. Bleeding: None.  Movement: Present. Denies leaking of fluid.  ----------------------------------------------------------------------------------- The following portions of the patient's history were reviewed and updated as appropriate: allergies, current medications, past family history, past medical history, past social history, past surgical history and problem list. Problem list updated.   Objective  Blood pressure 112/60, weight 170 lb (77.1 kg), last menstrual period 10/26/2017. Pregravid weight 151 lb (68.5 kg) Total Weight Gain 19 lb (8.618 kg) Urinalysis:      Fetal Status: Fetal Heart Rate (bpm): 121 Fundal Height: 32 cm Movement: Present     General:  Alert, oriented and cooperative. Patient is in no acute distress.  Skin: Skin is warm and dry. No rash noted.   Cardiovascular: Normal heart rate noted  Respiratory: Normal respiratory effort, no problems with respiration noted  Abdomen: Soft, gravid, appropriate for gestational age. Pain/Pressure: Present     Pelvic:  Cervical exam deferred        Extremities: Normal range of motion.     Mental Status: Normal mood and affect. Normal behavior. Normal judgment and thought content.     Assessment   27 y.o. [id number] at 4752w5d by  08/18/2018, by Ultrasound presenting for routine prenatal visit  Plan   FOURTH  Problems (from 12/29/17 to present)    Problem Noted Resolved   Supervision of other normal pregnancy, antepartum 12/29/2017 by Oswaldo ConroySchmid, Jacelyn Y, CNM No   Overview Addendum 06/07/2018  9:56 AM by Natale MilchSchuman, Christanna R, MD    Clinic [facility] Prenatal Labs  Dating 8 wk US Blood type: A/Positive/-- (07/23 1102)   Genetic Screen  NIPS: diploid XY Antibody:Negative (07/23 1102)  Anatomic US  Rubella: 1.41 (07/23 1102) Varicella: Immune  GTT Early:               Third trimester:  RPR: Non Reactive (07/23 1102)   Rhogam  HBsAg: Negative (07/23 1102)   TDaP vaccine                       Flu Shot: HIV: Non Reactive (07/23 1102)   Baby Food                                GBS:   Contraception  Pap: 01/08/2017, NILM  CBB     CS/VBAC    Support Person                  Gestational age appropriate obstetric precautions including but not limited to vaginal bleeding, contractions, leaking of fluid and fetal movement were reviewed in detail with the patient.    Having painful popping of hips with shooting pain down her medial thighs. No pain elicited on examination. Will refer to physical therapy. Discussed belly binder to help support pregnancy.  Growth US next visit for size > dates.  Return in about 2 weeks (around 06/21/2018) for ROB and US.  Christanna R Schuman  MD [facility], [facility] 06/07/2018, 10:32 AM

## 2018-06-07 NOTE — Progress Notes (Signed)
ROB/GTT C/o pain in pelvic area and legs, hips are popping and painful  Bt consent today/Tdap unavailable pt will need at next visit

## 2018-06-08 LAB — 28 WEEK RH+PANEL
BASOS: 0 %
Basophils Absolute: 0.1 10*3/uL (ref 0.0–0.2)
EOS (ABSOLUTE): 0.1 10*3/uL (ref 0.0–0.4)
EOS: 1 %
Gestational Diabetes Screen: 98 mg/dL (ref 65–139)
HEMOGLOBIN: 11.6 g/dL (ref 11.1–15.9)
HIV SCREEN 4TH GENERATION: NONREACTIVE
Hematocrit: 34.7 % (ref 34.0–46.6)
IMMATURE GRANS (ABS): 0.2 10*3/uL — AB (ref 0.0–0.1)
Immature Granulocytes: 1 %
Lymphocytes Absolute: 1.8 10*3/uL (ref 0.7–3.1)
Lymphs: 15 %
MCH: 29.2 pg (ref 26.6–33.0)
MCHC: 33.4 g/dL (ref 31.5–35.7)
MCV: 87 fL (ref 79–97)
MONOCYTES: 5 %
Monocytes Absolute: 0.6 10*3/uL (ref 0.1–0.9)
NEUTROS ABS: 9.5 10*3/uL — AB (ref 1.4–7.0)
Neutrophils: 78 %
Platelets: 198 10*3/uL (ref 150–450)
RBC: 3.97 x10E6/uL (ref 3.77–5.28)
RDW: 13 % (ref 12.3–15.4)
RPR Ser Ql: NONREACTIVE
WBC: 12.1 10*3/uL — ABNORMAL HIGH (ref 3.4–10.8)

## 2018-06-21 ENCOUNTER — Encounter: Payer: Self-pay | Admitting: Certified Nurse Midwife

## 2018-06-21 ENCOUNTER — Ambulatory Visit (INDEPENDENT_AMBULATORY_CARE_PROVIDER_SITE_OTHER): Payer: Medicaid Other

## 2018-06-21 ENCOUNTER — Ambulatory Visit (INDEPENDENT_AMBULATORY_CARE_PROVIDER_SITE_OTHER): Payer: Medicaid Other | Admitting: Certified Nurse Midwife

## 2018-06-21 VITALS — BP 104/58 | Wt 169.0 lb

## 2018-06-21 DIAGNOSIS — O26843 Uterine size-date discrepancy, third trimester: Secondary | ICD-10-CM

## 2018-06-21 DIAGNOSIS — Z23 Encounter for immunization: Secondary | ICD-10-CM | POA: Diagnosis not present

## 2018-06-21 DIAGNOSIS — Z3480 Encounter for supervision of other normal pregnancy, unspecified trimester: Secondary | ICD-10-CM

## 2018-06-21 DIAGNOSIS — Z3A31 31 weeks gestation of pregnancy: Secondary | ICD-10-CM

## 2018-06-21 DIAGNOSIS — F17210 Nicotine dependence, cigarettes, uncomplicated: Secondary | ICD-10-CM

## 2018-06-21 DIAGNOSIS — O99333 Smoking (tobacco) complicating pregnancy, third trimester: Secondary | ICD-10-CM

## 2018-06-21 MED ORDER — TETANUS-DIPHTH-ACELL PERTUSSIS 5-2.5-18.5 LF-MCG/0.5 IM SUSP
0.5000 mL | Freq: Once | INTRAMUSCULAR | Status: AC
  Administered 2018-06-21: 0.5 mL via INTRAMUSCULAR

## 2018-06-21 NOTE — Progress Notes (Signed)
ROB at 31wk5d: Good FM. No problems, Still smoking 1 PPD. Growth scan today for S>D: EFW 4#10oz (65.9%) with AFI 15.7cm. Wants to breast feed. Husband plans on vasectomy/ condoms TDAP today. ROB in 2 weeks.  Farrel Conners, CNM

## 2018-07-05 ENCOUNTER — Ambulatory Visit (INDEPENDENT_AMBULATORY_CARE_PROVIDER_SITE_OTHER): Payer: Medicaid Other | Admitting: Obstetrics and Gynecology

## 2018-07-05 VITALS — BP 116/72 | Wt 175.0 lb

## 2018-07-05 DIAGNOSIS — Z3480 Encounter for supervision of other normal pregnancy, unspecified trimester: Secondary | ICD-10-CM

## 2018-07-05 DIAGNOSIS — Z3483 Encounter for supervision of other normal pregnancy, third trimester: Secondary | ICD-10-CM

## 2018-07-05 DIAGNOSIS — Z3A33 33 weeks gestation of pregnancy: Secondary | ICD-10-CM

## 2018-07-05 LAB — POCT URINALYSIS DIPSTICK OB
GLUCOSE, UA: NEGATIVE
POC,PROTEIN,UA: NEGATIVE

## 2018-07-05 MED ORDER — CYCLOBENZAPRINE HCL 10 MG PO TABS: 10.0000 mg | ORAL_TABLET | Freq: Three times a day (TID) | ORAL | 0 refills | Status: AC | PRN

## 2018-07-05 NOTE — Progress Notes (Signed)
ROB

## 2018-07-05 NOTE — Progress Notes (Signed)
    Routine Prenatal Care Visit  Subjective  Tanya Mccarthy is a 28 y.o. [phone] at [redacted]w[redacted]d being seen today for ongoing prenatal care.  She is currently monitored for the following issues for this low-risk pregnancy and has Supervision of other normal pregnancy, antepartum and Tobacco use in pregnancy, antepartum on their problem list.  ----------------------------------------------------------------------------------- Patient reports no complaints.   Contractions: Irregular. Vag. Bleeding: None.  Movement: Present. Denies leaking of fluid.  ----------------------------------------------------------------------------------- The following portions of the patient's history were reviewed and updated as appropriate: allergies, current medications, past family history, past medical history, past social history, past surgical history and problem list. Problem list updated.   Objective  Blood pressure 116/72, weight 175 lb (79.4 kg), last menstrual period 10/26/2017. Pregravid weight 151 lb (68.5 kg) Total Weight Gain 24 lb (10.9 kg) Urinalysis:      Fetal Status: Fetal Heart Rate (bpm): 140   Movement: Present  Presentation: Vertex  General:  Alert, oriented and cooperative. Patient is in no acute distress.  Skin: Skin is warm and dry. No rash noted.   Cardiovascular: Normal heart rate noted  Respiratory: Normal respiratory effort, no problems with respiration noted  Abdomen: Soft, gravid, appropriate for gestational age. Pain/Pressure: Absent     Pelvic:  Cervical exam deferred        Extremities: Normal range of motion.     ental Status: Normal mood and affect. Normal behavior. Normal judgment and thought content.     Assessment   28 y.o. [id number] at [redacted]w[redacted]d by  08/18/2018, by Ultrasound presenting for routine prenatal visit  Plan   FOURTH Problems (from 12/29/17 to present)    Problem Noted Resolved   Supervision of other normal pregnancy, antepartum 12/29/2017 by Oswaldo Conroy, CNM  No   Overview Addendum 06/21/2018 11:56 AM by Farrel Conners, CNM    Clinic [facility] Prenatal Labs  Dating 8 wk [place] Blood type: A/Positive/-- (07/23 1102)   Genetic Screen  NIPS: diploid XY Antibody:Negative (07/23 1102)  Anatomic [place] Normal female anatomy, posterior placenta Rubella: 1.41 (07/23 1102) Varicella: Immune  GTT  trimester: 98 RPR: Non Reactive (07/23 1102)   Rhogam N/A HBsAg: Negative (07/23 1102)   TDaP vaccine        06/21/18               Flu Shot: 03/27/2019 HIV: Non Reactive (07/23 1102)   Baby Food          Breast                      GBS:   Contraception Vasectomy/ condoms Pap: 01/08/2017, NILM  CBB     CS/VBAC    Support Person                  Gestational age appropriate obstetric precautions including but not limited to vaginal bleeding, contractions, leaking of fluid and fetal movement were reviewed in detail with the patient.    Return in about 2 weeks (around 07/19/2018) for ROB.  Vena Austria, MD, [name] [facility], [facility] 07/05/2018, 10:23 AM

## 2018-07-19 ENCOUNTER — Ambulatory Visit (INDEPENDENT_AMBULATORY_CARE_PROVIDER_SITE_OTHER): Payer: Medicaid Other | Admitting: Certified Nurse Midwife

## 2018-07-19 VITALS — BP 120/70 | Wt 173.0 lb

## 2018-07-19 DIAGNOSIS — Z3483 Encounter for supervision of other normal pregnancy, third trimester: Secondary | ICD-10-CM

## 2018-07-19 DIAGNOSIS — Z3A35 35 weeks gestation of pregnancy: Secondary | ICD-10-CM

## 2018-07-19 DIAGNOSIS — Z3480 Encounter for supervision of other normal pregnancy, unspecified trimester: Secondary | ICD-10-CM

## 2018-07-19 LAB — POCT URINALYSIS DIPSTICK OB
Glucose, UA: NEGATIVE
POC,PROTEIN,UA: NEGATIVE

## 2018-07-19 NOTE — Progress Notes (Signed)
ROB at 35wk5d: Doing well. Episodes of more frequent contractions. No vaginal bleeding. Baby moving well FH 36cm/ FHT WNL Discussed preregistration and given L&D number with labor precautions  Farrel Conners, CNM

## 2018-07-19 NOTE — Progress Notes (Signed)
No c/o's

## 2018-07-26 ENCOUNTER — Telehealth: Payer: Self-pay

## 2018-07-26 NOTE — Telephone Encounter (Signed)
Pt calling c questions; should she go to L&D?; has appt 8:15 am.  [phone] Pt states she was seen in ED Saturday for sharp pain left rib cage area.  They said it was a pinched nerve.  Hasn't kept anything down since early yesterday am.  Adv to go to [facility] ED.  Pt states she will go to ED in [place].

## 2018-07-26 NOTE — Telephone Encounter (Signed)
Explained to pt that [facility] had her record whereas Tanya Mccarthy probably would not unless they had Epic.  Stated she will go to ED in [place].

## 2018-07-27 ENCOUNTER — Other Ambulatory Visit: Payer: Self-pay

## 2018-07-27 ENCOUNTER — Emergency Department: Payer: Medicaid Other

## 2018-07-27 ENCOUNTER — Encounter: Payer: Self-pay | Admitting: Emergency Medicine

## 2018-07-27 ENCOUNTER — Encounter: Payer: Medicaid Other | Admitting: Certified Nurse Midwife

## 2018-07-27 ENCOUNTER — Observation Stay
Admission: EM | Admit: 2018-07-27 | Discharge: 2018-07-28 | Disposition: A | Discharge status: Home / Self Care | Payer: Medicaid Other | Attending: Obstetrics and Gynecology | Admitting: Obstetrics and Gynecology

## 2018-07-27 DIAGNOSIS — R918 Other nonspecific abnormal finding of lung field: Secondary | ICD-10-CM | POA: Insufficient documentation

## 2018-07-27 DIAGNOSIS — O99283 Endocrine, nutritional and metabolic diseases complicating pregnancy, third trimester: Secondary | ICD-10-CM | POA: Diagnosis not present

## 2018-07-27 DIAGNOSIS — O212 Late vomiting of pregnancy: Secondary | ICD-10-CM | POA: Insufficient documentation

## 2018-07-27 DIAGNOSIS — O99333 Smoking (tobacco) complicating pregnancy, third trimester: Secondary | ICD-10-CM | POA: Diagnosis not present

## 2018-07-27 DIAGNOSIS — Z79899 Other long term (current) drug therapy: Secondary | ICD-10-CM | POA: Insufficient documentation

## 2018-07-27 DIAGNOSIS — O26893 Other specified pregnancy related conditions, third trimester: Principal | ICD-10-CM | POA: Insufficient documentation

## 2018-07-27 DIAGNOSIS — O26899 Other specified pregnancy related conditions, unspecified trimester: Secondary | ICD-10-CM | POA: Diagnosis present

## 2018-07-27 DIAGNOSIS — Z3480 Encounter for supervision of other normal pregnancy, unspecified trimester: Secondary | ICD-10-CM

## 2018-07-27 DIAGNOSIS — R109 Unspecified abdominal pain: Secondary | ICD-10-CM | POA: Diagnosis not present

## 2018-07-27 DIAGNOSIS — K219 Gastro-esophageal reflux disease without esophagitis: Secondary | ICD-10-CM | POA: Diagnosis not present

## 2018-07-27 DIAGNOSIS — R1011 Right upper quadrant pain: Secondary | ICD-10-CM | POA: Diagnosis present

## 2018-07-27 DIAGNOSIS — O219 Vomiting of pregnancy, unspecified: Secondary | ICD-10-CM | POA: Diagnosis present

## 2018-07-27 DIAGNOSIS — Z3A37 37 weeks gestation of pregnancy: Secondary | ICD-10-CM

## 2018-07-27 LAB — URINALYSIS, COMPLETE (UACMP) WITH MICROSCOPIC
Bilirubin Urine: NEGATIVE
GLUCOSE, UA: NEGATIVE mg/dL
Ketones, ur: 80 mg/dL — AB
Leukocytes,Ua: NEGATIVE
Nitrite: NEGATIVE
Protein, ur: 100 mg/dL — AB
Specific Gravity, Urine: 1.025 (ref 1.005–1.030)
pH: 5 (ref 5.0–8.0)

## 2018-07-27 LAB — HEPATIC FUNCTION PANEL
ALT: 29 U/L (ref 0–44)
AST: 37 U/L (ref 15–41)
Albumin: 3.3 g/dL — ABNORMAL LOW (ref 3.5–5.0)
Alkaline Phosphatase: 118 U/L (ref 38–126)
Bilirubin, Direct: 0.2 mg/dL (ref 0.0–0.2)
Indirect Bilirubin: 0.7 mg/dL (ref 0.3–0.9)
Total Bilirubin: 0.9 mg/dL (ref 0.3–1.2)
Total Protein: 7 g/dL (ref 6.5–8.1)

## 2018-07-27 LAB — CBC
HCT: 41.4 % (ref 36.0–46.0)
Hemoglobin: 13.6 g/dL (ref 12.0–15.0)
MCH: 28.8 pg (ref 26.0–34.0)
MCHC: 32.9 g/dL (ref 30.0–36.0)
MCV: 87.5 fL (ref 80.0–100.0)
NRBC: 0 % (ref 0.0–0.2)
Platelets: 203 10*3/uL (ref 150–400)
RBC: 4.73 MIL/uL (ref 3.87–5.11)
RDW: 13.7 % (ref 11.5–15.5)
WBC: 20.9 10*3/uL — ABNORMAL HIGH (ref 4.0–10.5)

## 2018-07-27 LAB — BASIC METABOLIC PANEL
Anion gap: 9 (ref 5–15)
BUN: 9 mg/dL (ref 6–20)
CO2: 15 mmol/L — ABNORMAL LOW (ref 22–32)
Calcium: 9.2 mg/dL (ref 8.9–10.3)
Chloride: 107 mmol/L (ref 98–111)
Creatinine, Ser: 0.54 mg/dL (ref 0.44–1.00)
GFR calc non Af Amer: >60 mL/min (ref >60)
Glucose, Bld: 103 mg/dL — ABNORMAL HIGH (ref 70–99)
Potassium: 3.9 mmol/L (ref 3.5–5.1)
Sodium: 131 mmol/L — ABNORMAL LOW (ref 135–145)

## 2018-07-27 LAB — TROPONIN I

## 2018-07-27 LAB — LIPASE, BLOOD: Lipase: 20 U/L (ref 11–51)

## 2018-07-27 MED ORDER — ONDANSETRON HCL 4 MG PO TABS: 4.0000 mg | ORAL_TABLET | Freq: Four times a day (QID) | ORAL | Status: DC | PRN

## 2018-07-27 MED ORDER — PRENATAL MULTIVITAMIN CH
1.0000 | ORAL_TABLET | Freq: Every day | ORAL | Status: DC
  Administered 2018-07-28: 1 via ORAL
  Filled 2018-07-27: qty 1

## 2018-07-27 MED ORDER — SODIUM CHLORIDE 0.9 % IV BOLUS
1000.0000 mL | Freq: Once | INTRAVENOUS | Status: AC
  Administered 2018-07-27: 1000 mL via INTRAVENOUS

## 2018-07-27 MED ORDER — DOCUSATE SODIUM 100 MG PO CAPS
100.0000 mg | ORAL_CAPSULE | Freq: Two times a day (BID) | ORAL | Status: DC
  Administered 2018-07-27 – 2018-07-28 (×2): 100 mg via ORAL
  Filled 2018-07-27 (×2): qty 1

## 2018-07-27 MED ORDER — CYCLOBENZAPRINE HCL 10 MG PO TABS
10.0000 mg | ORAL_TABLET | Freq: Three times a day (TID) | ORAL | Status: DC | PRN
  Administered 2018-07-28: 10 mg via ORAL
  Filled 2018-07-27 (×2): qty 1

## 2018-07-27 MED ORDER — PANTOPRAZOLE SODIUM 40 MG IV SOLR
40.0000 mg | Freq: Two times a day (BID) | INTRAVENOUS | Status: DC
  Administered 2018-07-28 (×2): 40 mg via INTRAVENOUS
  Filled 2018-07-27 (×3): qty 40

## 2018-07-27 MED ORDER — CEFAZOLIN SODIUM-DEXTROSE 1-4 GM/50ML-% IV SOLN
1.0000 g | Freq: Two times a day (BID) | INTRAVENOUS | Status: DC
  Administered 2018-07-28: 1 g via INTRAVENOUS
  Filled 2018-07-27 (×2): qty 50

## 2018-07-27 MED ORDER — CEFAZOLIN SODIUM-DEXTROSE 2-4 GM/100ML-% IV SOLN
2.0000 g | Freq: Once | INTRAVENOUS | Status: AC
  Administered 2018-07-28: 2 g via INTRAVENOUS
  Filled 2018-07-27 (×2): qty 100

## 2018-07-27 MED ORDER — SUCRALFATE 1 G PO TABS
1.0000 g | ORAL_TABLET | Freq: Three times a day (TID) | ORAL | Status: DC
  Administered 2018-07-28: 1 g via ORAL
  Filled 2018-07-27 (×5): qty 1

## 2018-07-27 MED ORDER — THIAMINE HCL 100 MG/ML IJ SOLN
Freq: Once | INTRAVENOUS | Status: DC
  Filled 2018-07-27: qty 1000

## 2018-07-27 MED ORDER — ONDANSETRON HCL 4 MG/2ML IJ SOLN
4.0000 mg | Freq: Four times a day (QID) | INTRAMUSCULAR | Status: DC | PRN
  Administered 2018-07-27 – 2018-07-28 (×2): 4 mg via INTRAVENOUS
  Filled 2018-07-27 (×2): qty 2

## 2018-07-27 MED ORDER — DEXTROSE IN LACTATED RINGERS 5 % IV SOLN
INTRAVENOUS | Status: DC
  Administered 2018-07-27 – 2018-07-28 (×2): via INTRAVENOUS

## 2018-07-27 NOTE — OB Triage Note (Signed)
Recvd pt from ED. Pt c/o right sided pain. States she thinks she feels like she pulled something from throwing up. Thrown up last two hours ago. No vaginal bleeding or LOF. Feeling baby move well. States her indigestion is terrible.

## 2018-07-27 NOTE — H&P (Signed)
History and Physical  Tanya Mccarthy is an 28 y.o. female.  HPI:  She presents to triage today complaining of four days not being able to eat or drink. She has vomited three times today, once recently in the triage area. She was seen over the weekend in [place]. She received IV fluids there and says that she felt better after 2 bags of fluid. However, once she went home her symptoms returned. She was seen and evaluated by the ER this afternoon/evening. There was suspicion for gallstones, but she had a normal RUQ [place].   She describes her abdominal pain as a constant ache that has been present for about 24 hours. It extends the length of her right side. She has some tenderness to palpation.  She denies leakage of fluid. She denies vaginal bleeding. She reports good fetal movement. She denies contractions. She denies fevers. She denies uterine tenderness. She denies urinary urgency, denies frequency, denies dysuria. Denies flank pain. She has had some lower back pain, but reports that has been present for more than the last 4 days.    FOURTH Problems (from 12/29/17 to present)    Problem Noted Resolved   Supervision of other normal pregnancy, antepartum 12/29/2017 by Oswaldo Conroy, CNM No   Overview Addendum 07/05/2018 10:07 AM by Vena Austria, MD    [facility] Prenatal Labs  Dating 8 wk [place] Blood type: A/Positive/-- (07/23 1102)   Genetic Screen  NIPS: diploid XY Antibody:Negative (07/23 1102)  Anatomic [place] Normal female anatomy, posterior placenta Rubella: 1.41 (07/23 1102) Varicella: Immune  GTT Third trimester: 98 RPR: Non Reactive (07/23 1102)   Rhogam N/A HBsAg: Negative (07/23 1102)   TDaP vaccine        06/21/18               Flu Shot: 03/27/2019 HIV: Non Reactive (07/23 1102)   Baby Food          Breast                      GBS:   Contraception Vasectomy/ condoms Pap: 01/08/2017, NILM  CBB   Pelvis tested to 8lbs 6oz  CS/VBAC    Support Person                OB History   Gravida Para Term Preterm AB Living  4 2 2   1 2   SAB TAB Ectopic Multiple Live Births          2    # Outcome Date GA Lbr Len/2nd Weight Sex Delivery Anes PTL Lv  4 Current           3 Term 10/18/12   3799 g M Vag-Spont   LIV  2 Term 09/23/10   3771 g M Vag-Spont   LIV  1 AB              Past Medical History:  Diagnosis Date  . Abnormal Pap smear of cervix 2015   +HPV  . History of Papanicolaou smear of cervix 03/03/14; 10/02/15   ASCUS/+HPV; NEG, CT/GC/TR NEG;  . Patient denies medical problems     Past Surgical History:  Procedure Laterality Date  . NO PAST SURGERIES    . WISDOM TOOTH EXTRACTION  2012   ONE    Family History  Problem Relation Age of Onset  . Cancer Mother 20       CERVIX, OVARY  . Diabetes Maternal Grandmother   .  Cancer Maternal Grandfather [id number]       KIDNEY, LIVER, STOMACH  . Cancer Paternal Grandfather        COLON    Social History:  reports that she has been smoking cigarettes. She has been smoking about 1.00 pack per day. She has never used smokeless tobacco. She reports previous alcohol use. She reports that she does not use drugs.  Allergies:  Allergies  Allergen Reactions  . Zoloft [Sertraline Hcl] Swelling  . Sulfa Antibiotics     Medications: I have reviewed the patient's current medications.  Results for orders placed or performed during the hospital encounter of 07/27/18 (from the past 48 hour(s))  Basic metabolic panel     Status: Abnormal   Collection Time: 07/27/18  3:17 PM  Result Value Ref Range   Sodium 131 (L) 135 - 145 mmol/L   Potassium 3.9 3.5 - 5.1 mmol/L   Chloride 107 98 - 111 mmol/L   CO2 15 (L) 22 - 32 mmol/L   Glucose, Bld 103 (H) 70 - 99 mg/dL   BUN 9 6 - 20 mg/dL   Creatinine, Ser 1.610.54 0.44 - 1.00 mg/dL   Calcium 9.2 8.9 - 09.610.3 mg/dL   GFR calc non Af Amer >60 >60 mL/min   GFR calc Af Amer >60 >60 mL/min   Anion gap 9 5 - 15    Comment: Performed at [facility], [address]  [place], [place] [address]  CBC     Status: Abnormal   Collection Time: 07/27/18  3:17 PM  Result Value Ref Range   WBC 20.9 (H) 4.0 - 10.5 K/uL   RBC 4.73 3.87 - 5.11 MIL/uL   Hemoglobin 13.6 12.0 - 15.0 g/dL   HCT 98.141.4 19.136.0 - 47.846.0 %   MCV 87.5 80.0 - 100.0 fL   MCH 28.8 26.0 - 34.0 pg   MCHC 32.9 30.0 - 36.0 g/dL   RDW 29.513.7 62.111.5 - 30.815.5 %   Platelets 203 150 - 400 K/uL   nRBC 0.0 0.0 - 0.2 %    Comment: Performed at [facility], [address] [place], [place] [address]  Troponin I - ONCE - STAT     Status: None   Collection Time: 07/27/18  3:17 PM  Result Value Ref Range   Troponin I <0.03 <0.03 ng/mL    Comment: Performed at [facility], [address] [place], [place] [address]  Hepatic function panel     Status: Abnormal   Collection Time: 07/27/18  3:17 PM  Result Value Ref Range   Total Protein 7.0 6.5 - 8.1 g/dL   Albumin 3.3 (L) 3.5 - 5.0 g/dL   AST 37 15 - 41 U/L   ALT 29 0 - 44 U/L   Alkaline Phosphatase 118 38 - 126 U/L   Total Bilirubin 0.9 0.3 - 1.2 mg/dL   Bilirubin, Direct 0.2 0.0 - 0.2 mg/dL   Indirect Bilirubin 0.7 0.3 - 0.9 mg/dL    Comment: Performed at [facility], [address] [place], [place] [address]  Lipase, blood     Status: None   Collection Time: 07/27/18  3:17 PM  Result Value Ref Range   Lipase 20 11 - 51 U/L    Comment: Performed at [facility], [address] [place], [place] [address]  Urinalysis, Complete w Microscopic     Status: Abnormal   Collection Time: 07/27/18  7:15 PM  Result Value Ref Range   Color, Urine  YELLOW (A) YELLOW   APPearance HAZY (A) CLEAR   Specific Gravity, Urine 1.025 1.005 - 1.030   pH 5.0 5.0 - 8.0   Glucose, UA NEGATIVE NEGATIVE mg/dL   Hgb urine dipstick SMALL (A) NEGATIVE   Bilirubin Urine NEGATIVE NEGATIVE   Ketones, ur 80 (A) NEGATIVE mg/dL   Protein, ur 161 (A) NEGATIVE mg/dL   Nitrite NEGATIVE NEGATIVE   Leukocytes,Ua NEGATIVE NEGATIVE   RBC / HPF 11-20 0 -  5 RBC/hpf   WBC, UA 11-20 0 - 5 WBC/hpf   Bacteria, UA FEW (A) NONE SEEN   Squamous Epithelial / LPF 6-10 0 - 5   Mucus PRESENT    Amorphous Crystal PRESENT     Comment: Performed at [facility], [address] [place], [place] [address]    Dg Chest 2 View  Result Date: 07/27/2018 CLINICAL DATA:  Pt reports right sided chest pain that started earlier today and has continued. Pt states she was seen at another hospital with the same complaints and her results came back normal. Chest pain has not improved. EXAM: CHEST - 2 VIEW COMPARISON:  11/27/2014 FINDINGS: Heart size is normal. There is minimal subsegmental atelectasis or scarring at the LEFT lung base. No pulmonary edema. No consolidations. IMPRESSION: Minimal atelectasis or scarring at the LEFT lung base. Electronically Signed   By: Norva Pavlov M.D.   On: 07/27/2018 16:04   US Abdomen Limited Ruq  Result Date: 07/27/2018 CLINICAL DATA:  Right upper quadrant pain. Thirty-seven week pregnant female. No EXAM: ULTRASOUND ABDOMEN LIMITED RIGHT UPPER QUADRANT COMPARISON:  None. FINDINGS: Gallbladder: No gallstones or wall thickening visualized. 3 mm single wall thickness of the gallbladder. No sonographic Murphy sign noted by sonographer. Common bile duct: Diameter: 3 mm Liver: No focal lesion identified. Within normal limits in parenchymal echogenicity. Portal vein is patent on color Doppler imaging with normal direction of blood flow towards the liver. Other: The partially included right kidney suggest presence of mild right-sided hydronephrosis that may be due to the gravid uterus. IMPRESSION: Normal right upper quadrant abdominal ultrasound. Fullness of the right intrarenal collecting system question mild hydronephrosis secondary to the patient's gravid uterus. Electronically Signed   By: Tollie Eth M.D.   On: 07/27/2018 19:54    Review of Systems  Constitutional: Positive for malaise/fatigue. Negative for chills, fever and  weight loss.  HENT: Negative for congestion, hearing loss and sinus pain.   Eyes: Negative for blurred vision and double vision.  Respiratory: Negative for cough, sputum production, shortness of breath and wheezing.   Cardiovascular: Negative for chest pain, palpitations, orthopnea and leg swelling.  Gastrointestinal: Positive for abdominal pain, nausea and vomiting. Negative for constipation and diarrhea.  Genitourinary: Negative for dysuria, flank pain, frequency, hematuria and urgency.  Musculoskeletal: Positive for back pain. Negative for falls and joint pain.  Skin: Negative for itching and rash.  Neurological: Negative for dizziness and headaches.  Psychiatric/Behavioral: Negative for depression, substance abuse and suicidal ideas. The patient is not nervous/anxious.    Blood pressure 136/82, pulse 92, temperature 98 F (36.7 C), resp. rate 16, height 5\' 6"  (1.676 m), weight 79.4 kg, last menstrual period 10/26/2017, SpO2 100 %. Physical Exam  Nursing note and vitals reviewed. Constitutional: She is oriented to person, place, and time. She appears well-developed and well-nourished.  HENT:  Head: Normocephalic and atraumatic.  Cardiovascular: Normal rate and regular rhythm.  Respiratory: Effort normal and breath sounds normal.  GI: Soft. Bowel sounds are normal. She exhibits  no distension and no mass. There is abdominal tenderness. There is no rebound and no guarding.  No fundal tenderness  Genitourinary:    Vagina normal.     Genitourinary Comments: SVE: 0/0/-3   Musculoskeletal: Normal range of motion.  Neurological: She is alert and oriented to person, place, and time.  Skin: Skin is warm and dry.  Psychiatric: She has a normal mood and affect. Her behavior is normal. Judgment and thought content normal.   Bedside US: cephalic, adequate fluid with a 4x4cm pocket, Active infant  With FHT present  NST: 135 bpm baseline, moderate variability, 15x15 accelerations, o  decelerations. Tocometer : irregular  Assessment/Plan: 28 yo G4P2012 [redacted]w[redacted]d 1. Possible UTI vs pyelonephritis, will keep patient for IV antibiotics since she has not been able to tolerate PO treatment. Urine culture pending. Results not available from outside institution.  2. Severe GERD- will start carafate. Can consider adding protonix. 3. IV fluid resuscitation and correction of hyponatremia  4. Advance diet as tolerated 5. Fetal heart monitoring every shift per policy 6. GBS and  Gonorrhea chlamydia swabs today.  7. Repeat CBC and BMP in the morning  Christanna R Schuman 07/27/2018, 9:28 PM

## 2018-07-27 NOTE — ED Triage Notes (Addendum)
Pt presents to ED via POV c/o RUQ pain/R chest pain starting today. Pt is [redacted] wks pregnant, states normal fetal movement, no complications.

## 2018-07-27 NOTE — ED Provider Notes (Addendum)
[facility] Emergency Department Provider Note  ____________________________________________   I have reviewed the triage vital signs and the nursing notes. Where available I have reviewed prior notes and, if possible and indicated, outside hospital notes.    HISTORY  Chief Complaint Chest Pain    HPI Tanya Mccarthy is a 28 y.o. female who presents today complaining of right-sided abdominal pain.  Patient is [redacted] weeks pregnant.  She is on her third density..  She states that feels the baby moves has had no gush of fluids.  She had some pain on the left side, and it felt like a muscle pull went to an outside hospital where she reports had a negative CT scan of her chest, negative chest x-ray, and was told she might have a UTI so they started her on antibiotics but she has not been able to take the antibiotics because, since being there, she has been vomiting.  She is not having any bloody or bilious vomiting she is having no diarrhea.  She has no right sided pain which  goes around towards her flank she states.  Denies dysuria.  She was seen twice, once on Saturday once yesterday at this other facility.  She has received IV fluids she states.  She states that she has no PE on their CT scan.  She is here because she has persistent vomiting and cannot take the antibiotics she was given.  No chest pain, the pain is now focally to the right quadrant, no right lower quadrant abdominal pain, no fever no chills.   Past Medical History:  Diagnosis Date  . Abnormal Pap smear of cervix 2015   +HPV  . History of Papanicolaou smear of cervix 03/03/14; 10/02/15   ASCUS/+HPV; NEG, CT/GC/TR NEG;  . Patient denies medical problems     Patient Active Problem List   Diagnosis Date Noted  . Supervision of other normal pregnancy, antepartum 12/29/2017  . Tobacco use in pregnancy, antepartum 12/29/2017    Past Surgical History:  Procedure Laterality Date  . NO PAST SURGERIES    .  WISDOM TOOTH EXTRACTION  2012   ONE    Prior to Admission medications   Medication Sig Start Date End Date Taking? Authorizing Provider  cyclobenzaprine (FLEXERIL) 10 MG tablet Take 1 tablet (10 mg total) by mouth 3 (three) times daily as needed for muscle spasms. 07/05/18   Vena Austria, MD  Doxylamine-Pyridoxine 10-10 MG TBEC Take 2 tablets at bedtime. If needed, take 1 tablet at breakfast and 1 tablet in the afternoon Patient not taking: Reported on 06/21/2018 01/06/18   Tresea Mall, CNM  Prenatal Vit-Fe Fumarate-FA (MULTIVITAMIN-PRENATAL) 27-0.8 MG TABS tablet Take 1 tablet by mouth daily at 12 noon.    [provider]    Allergies Zoloft [sertraline hcl] and Sulfa antibiotics  Family History  Problem Relation Age of Onset  . Cancer Mother 20       CERVIX, OVARY  . Diabetes Maternal Grandmother   . Cancer Maternal Grandfather 89       KIDNEY, LIVER, STOMACH  . Cancer Paternal Grandfather        COLON    Social History Social History   Tobacco Use  . Smoking status: Current Every Day Smoker    Packs/day: 1.00    Types: Cigarettes  . Smokeless tobacco: Never Used  Substance Use Topics  . Alcohol use: Not Currently    Comment: Occ.  . Drug use: No    Review of  Systems Constitutional: No fever/chills Eyes: No visual changes. ENT: No sore throat. No stiff neck no neck pain Cardiovascular: Denies chest pain. Respiratory: Denies shortness of breath. Gastrointestinal:   + vomiting.  No diarrhea.  No constipation. Genitourinary: Negative for dysuria. Musculoskeletal: Negative lower extremity swelling Skin: Negative for rash. Neurological: Negative for severe headaches, focal weakness or numbness.   ____________________________________________   PHYSICAL EXAM:  VITAL SIGNS: ED Triage Vitals  Enc Vitals Group     BP 07/27/18 1516 133/86     Pulse Rate 07/27/18 1516 99     Resp 07/27/18 1516 18     Temp 07/27/18 1516 98.2 F (36.8 C)     Temp  Source 07/27/18 1516 Oral     SpO2 07/27/18 1516 100 %     Weight 07/27/18 1513 175 lb (79.4 kg)     Height 07/27/18 1513 5\' 6"  (1.676 m)     Head Circumference --      Peak Flow --      Pain Score 07/27/18 1512 4     Pain Loc --      Pain Edu? --      Excl. in GC? --     Constitutional: Alert and oriented. Well appearing and in no acute distress. Eyes: Conjunctivae are normal Head: Atraumatic HEENT: No congestion/rhinnorhea. Mucous membranes are moist.  Oropharynx non-erythematous Neck:   Nontender with no meningismus, no masses, no stridor Cardiovascular: Normal rate, regular rhythm. Grossly normal heart sounds.  Good peripheral circulation. Respiratory: Normal respiratory effort.  No retractions. Lungs CTAB. Abdominal: Gravid uterus consistent with dates, nurse in the right upper quadrant to some extent, little bit of right flank pain, there is no surgical signs. Back:  There is no focal tenderness or step off.  there is no midline tenderness there are no lesions noted. there is + CVA tenderness Musculoskeletal: No lower extremity tenderness, no upper extremity tenderness. No joint effusions, no DVT signs strong distal pulses no edema Neurologic:  Normal speech and language. No gross focal neurologic deficits are appreciated.  Skin:  Skin is warm, dry and intact. No rash noted. Psychiatric: Mood and affect are normal. Speech and behavior are normal.  ____________________________________________   LABS (all labs ordered are listed, but only abnormal results are displayed)  Labs Reviewed  BASIC METABOLIC PANEL - Abnormal; Notable for the following components:      Result Value   Sodium 131 (*)    CO2 15 (*)    Glucose, Bld 103 (*)    All other components within normal limits  CBC - Abnormal; Notable for the following components:   WBC 20.9 (*)    All other components within normal limits  HEPATIC FUNCTION PANEL - Abnormal; Notable for the following components:   Albumin 3.3  (*)    All other components within normal limits  TROPONIN I  LIPASE, BLOOD  URINALYSIS, COMPLETE (UACMP) WITH MICROSCOPIC    Pertinent labs  results that were available during my care of the patient were reviewed by me and considered in my medical decision making (see chart for details). ____________________________________________  EKG  I personally interpreted any EKGs ordered by me or triage Mild tachycardia noted, no acute ST elevation nonspecific ST changes normal axis no significant change from 2016 ____________________________________________  RADIOLOGY  Pertinent labs & imaging results that were available during my care of the patient were reviewed by me and considered in my medical decision making (see chart for details). If possible, patient and/or family  made aware of any abnormal findings.  Dg Chest 2 View  Result Date: 07/27/2018 CLINICAL DATA:  Pt reports right sided chest pain that started earlier today and has continued. Pt states she was seen at another hospital with the same complaints and her results came back normal. Chest pain has not improved. EXAM: CHEST - 2 VIEW COMPARISON:  11/27/2014 FINDINGS: Heart size is normal. There is minimal subsegmental atelectasis or scarring at the LEFT lung base. No pulmonary edema. No consolidations. IMPRESSION: Minimal atelectasis or scarring at the LEFT lung base. Electronically Signed   By: Norva Pavlov M.D.   On: 07/27/2018 16:04   ____________________________________________    PROCEDURES  Procedure(s) performed: None  Procedures  Critical Care performed: None  ____________________________________________   INITIAL IMPRESSION / ASSESSMENT AND PLAN / ED COURSE  Pertinent labs & imaging results that were available during my care of the patient were reviewed by me and considered in my medical decision making (see chart for details).  Patient with her third visit to the ER in the last few days.  This time it is  for right-sided abdominal discomfort in the context of late stage pregnancy.  We will get fetal heart tones.  She has no pregnancy related issues that immediately come to mind aside from the discomfort.  Specifically she has no gush of fluid or vaginal bleeding.  Her uterus does not appear to be tender to me and she is feeling the baby move.  She has had persistent vomiting over the last couple days which is atypical for her.  She had morning sickness in the beginning stages of pregnancy and that is now resolved.  White count is elevated which is not particularly sensitive finding in this age group.  I do not detect any lower abdominal pain to make me suspect appendicitis, the time course seems to be a little off for that as well.  She is actually more focally tender over the right upper quadrant liver function tests are normal, gallbladder disease is a concern, she likely will need an ultrasound however, we will talk to OB/GYN to see if they wish to bring her up to triage per protocol for monitoring and further evaluation before I keep her down here in the emergency room any longer.  There was a question as to whether she has a urinary tract infection, we will see if we can get a urine sample we will give her IV fluids.  She is nontoxic in appearance.  Herself, after 2 prior pregnancies does not feel that she is in labor.  Patient is in a whole bed there is apparently no place to put her.  Usually abdominal pain in a patient of her gravid status goes directly upstairs per protocol, for this reason I called Dr. Jerene Pitch.  I appreciate the consult.  Discussed that at this time there is no way for me to know whether the patient is in labor and I can easily do a pelvic exam because we have no beds in the department, however, she states that she wants me to keep the patient here and do the ultrasound and have her go upstairs and I will follow-up on it.  Start IV fluids, again patient has had no gush of fluid or labor  contractions, and we will see about getting an ultrasound at the request of OB prior to going upstairs.   ____________________________________________   FINAL CLINICAL IMPRESSION(S) / ED DIAGNOSES  Final diagnoses:  None  This chart was dictated using voice recognition software.  Despite best efforts to proofread,  errors can occur which can change meaning.      Jeanmarie PlantMcShane, Wandalene Abrams A, MD 07/27/18 [id number]    Jeanmarie PlantMcShane, Analeah Brame A, MD 07/27/18 [id number]    Jeanmarie PlantMcShane, Apple Dearmas A, MD 07/27/18 1925

## 2018-07-28 ENCOUNTER — Encounter: Payer: Medicaid Other | Admitting: Maternal Newborn

## 2018-07-28 DIAGNOSIS — Z3A37 37 weeks gestation of pregnancy: Secondary | ICD-10-CM | POA: Diagnosis not present

## 2018-07-28 DIAGNOSIS — O26893 Other specified pregnancy related conditions, third trimester: Secondary | ICD-10-CM | POA: Diagnosis not present

## 2018-07-28 DIAGNOSIS — R109 Unspecified abdominal pain: Secondary | ICD-10-CM | POA: Diagnosis not present

## 2018-07-28 DIAGNOSIS — O26899 Other specified pregnancy related conditions, unspecified trimester: Secondary | ICD-10-CM | POA: Diagnosis present

## 2018-07-28 DIAGNOSIS — O212 Late vomiting of pregnancy: Secondary | ICD-10-CM | POA: Diagnosis not present

## 2018-07-28 LAB — BASIC METABOLIC PANEL
Anion gap: 9 (ref 5–15)
BUN: 9 mg/dL (ref 6–20)
CO2: 16 mmol/L — ABNORMAL LOW (ref 22–32)
Calcium: 8.8 mg/dL — ABNORMAL LOW (ref 8.9–10.3)
Chloride: 108 mmol/L (ref 98–111)
Creatinine, Ser: 0.55 mg/dL (ref 0.44–1.00)
GFR calc Af Amer: >60 mL/min (ref >60)
GFR calc non Af Amer: >60 mL/min (ref >60)
Glucose, Bld: 117 mg/dL — ABNORMAL HIGH (ref 70–99)
Potassium: 3.6 mmol/L (ref 3.5–5.1)
Sodium: 133 mmol/L — ABNORMAL LOW (ref 135–145)

## 2018-07-28 LAB — CBC
HCT: 37.4 % (ref 36.0–46.0)
Hemoglobin: 12.4 g/dL (ref 12.0–15.0)
MCH: 28.3 pg (ref 26.0–34.0)
MCHC: 33.2 g/dL (ref 30.0–36.0)
MCV: 85.4 fL (ref 80.0–100.0)
PLATELETS: 184 10*3/uL (ref 150–400)
RBC: 4.38 MIL/uL (ref 3.87–5.11)
RDW: 13.6 % (ref 11.5–15.5)
WBC: 18.4 10*3/uL — ABNORMAL HIGH (ref 4.0–10.5)
nRBC: 0 % (ref 0.0–0.2)

## 2018-07-28 MED ORDER — METOCLOPRAMIDE HCL 5 MG/ML IJ SOLN
10.0000 mg | Freq: Four times a day (QID) | INTRAMUSCULAR | Status: DC | PRN
  Administered 2018-07-28: 10 mg via INTRAVENOUS
  Filled 2018-07-28 (×2): qty 2

## 2018-07-28 MED ORDER — PROMETHAZINE HCL 25 MG PO TABS: 25.0000 mg | ORAL_TABLET | Freq: Four times a day (QID) | ORAL | 0 refills | Status: DC | PRN

## 2018-07-28 MED ORDER — CALCIUM CARBONATE ANTACID 500 MG PO CHEW
2.0000 | CHEWABLE_TABLET | ORAL | Status: DC | PRN
  Administered 2018-07-28: 400 mg via ORAL
  Filled 2018-07-28: qty 2

## 2018-07-28 MED ORDER — PROMETHAZINE HCL 25 MG/ML IJ SOLN
25.0000 mg | Freq: Four times a day (QID) | INTRAMUSCULAR | Status: DC | PRN
  Administered 2018-07-28: 25 mg via INTRAVENOUS
  Filled 2018-07-28: qty 1

## 2018-07-28 MED ORDER — ALUM & MAG HYDROXIDE-SIMETH 200-200-20 MG/5ML PO SUSP
30.0000 mL | Freq: Four times a day (QID) | ORAL | Status: DC | PRN
  Administered 2018-07-28: 30 mL via ORAL
  Filled 2018-07-28: qty 30

## 2018-07-28 NOTE — Progress Notes (Signed)
Pt brought over from mother baby for her daily NST. Pt vitals WDL and pt has no c/o LOF, VB and states positive FM. Monitors applied and initial fht 135.

## 2018-07-28 NOTE — Progress Notes (Signed)
D/C instructions provided, pt states understanding, aware of follow up appt. Pt states would like to make her own appt.  D/C home to car via auxiliary in wheelchair.

## 2018-07-28 NOTE — Discharge Summary (Addendum)
Physician Final Progress Note  Patient ID: Tanya Mccarthy MRN: [id number] DOB/AGE: Sep 09, 1990 27 y.o.  Admit date: 07/27/2018 Admitting provider: Natale Milch, MD Discharge date: 07/28/2018   Admission Diagnoses: Pregnancy with flank pain, antepartum, Nausea and vomiting in pregnancy  Discharge Diagnoses:  Active Problems:   Nausea and vomiting in pregnancy   Pregnancy with flank pain, antepartum IUP at 37 weeks Reactive NST   History of Present Illness: The patient is a 28 y.o. female [phone] at [redacted]w[redacted]d who presents for four days of nausea and vomiting. She developed pain in her right upper and lower quadrants following the vomiting. She was seen in [place] [facility] over the weekend where she received IV fluids and was discharged in stable condition. She was discharged home with antibiotics for presumptive UTI which she had not started taking. Her pain and nausea/vomiting returned yesterday and then came to [facility]. Imaging was done and there was no sign of gall stones. There was no rebound tenderness or guarding of RLQ. This morning her nausea and vomiting have improved. She still has some pain on right flank but it is not as severe as it was. She is tolerating PO intake this morning and is requesting discharge to home. She is discharged to home in stable condition with precautions and she needs to reschedule her next ROB.  Past Medical History:  Diagnosis Date  . Abnormal Pap smear of cervix 2015   +HPV  . History of Papanicolaou smear of cervix 03/03/14; 10/02/15   ASCUS/+HPV; NEG, CT/GC/TR NEG;  . Patient denies medical problems     Past Surgical History:  Procedure Laterality Date  . NO PAST SURGERIES    . WISDOM TOOTH EXTRACTION  2012   ONE    No current facility-administered medications on file prior to encounter.    Current Outpatient Medications on File Prior to Encounter  Medication Sig Dispense Refill  . cyclobenzaprine (FLEXERIL) 10 MG tablet Take 1 tablet (10 mg  total) by mouth 3 (three) times daily as needed for muscle spasms. 30 tablet 0  . ondansetron (ZOFRAN) 4 MG tablet Take 4 mg by mouth every 8 (eight) hours as needed for nausea or vomiting.    . Prenatal Vit-Fe Fumarate-FA (MULTIVITAMIN-PRENATAL) 27-0.8 MG TABS tablet Take 1 tablet by mouth daily at 12 noon.    . Doxylamine-Pyridoxine 10-10 MG TBEC Take 2 tablets at bedtime. If needed, take 1 tablet at breakfast and 1 tablet in the afternoon (Patient not taking: Reported on 06/21/2018) 120 tablet 1    Allergies  Allergen Reactions  . Zoloft [Sertraline Hcl] Swelling  . Sulfa Antibiotics     Social History   Socioeconomic History  . Marital status: Single    Spouse name: Not on file  . Number of children: 2  . Years of education: 81  . Highest education level: Not on file  Occupational History  . Occupation: UNEMPLOYED  Social Needs  . Financial resource strain: Not on file  . Food insecurity:    Worry: Not on file    Inability: Not on file  . Transportation needs:    Medical: Not on file    Non-medical: Not on file  Tobacco Use  . Smoking status: Current Every Day Smoker    Packs/day: 1.00    Types: Cigarettes  . Smokeless tobacco: Never Used  Substance and Sexual Activity  . Alcohol use: Not Currently    Comment: Occ.  . Drug use: No  . Sexual activity: Yes  Birth control/protection: None  Lifestyle  . Physical activity:    Days per week: Not on file    Minutes per session: Not on file  . Stress: Not on file  Relationships  . Social connections:    Talks on phone: Not on file    Gets together: Not on file    Attends religious service: Not on file    Active member of club or organization: Not on file    Attends meetings of clubs or organizations: Not on file    Relationship status: Not on file  . Intimate partner violence:    Fear of current or ex partner: Not on file    Emotionally abused: Not on file    Physically abused: Not on file    Forced sexual  activity: Not on file  Other Topics Concern  . Not on file  Social History Narrative  . Not on file    Family History  Problem Relation Age of Onset  . Cancer Mother 20       CERVIX, OVARY  . Diabetes Maternal Grandmother   . Cancer Maternal Grandfather 1365       KIDNEY, LIVER, STOMACH  . Cancer Paternal Grandfather        COLON     Review of Systems  Constitutional: Negative.   HENT: Negative.   Eyes: Negative.   Respiratory: Negative.   Cardiovascular: Negative.   Gastrointestinal: Positive for abdominal pain and nausea.  Genitourinary: Positive for flank pain.  Skin: Negative.   Neurological: Negative.   Endo/Heme/Allergies: Negative.   Psychiatric/Behavioral: Negative.   Musculoskeletal: Negative   Physical Exam: BP 128/84 (BP Location: Right Arm)   Pulse 81   Temp 97.8 F (36.6 C) (Oral)   Resp 18   Ht 5\' 6"  (1.676 m)   Wt 79.4 kg   LMP 10/26/2017 (Approximate)   SpO2 99%   BMI 28.25 kg/m   Constitutional: Well nourished, well developed female in no acute distress.  HEENT: normal Skin: Warm and dry.  Cardiovascular: Regular rate and rhythm.   Extremity: no edema  Respiratory: Clear to auscultation bilateral. Normal respiratory effort Abdomen: FHT present Back: no CVAT, mild tenderness is focused at right hip area Psych: Alert and Oriented x3. No memory deficits. Normal mood and affect.   Fetal Well Being: Baseline 135 bpm, moderate variability, +accelerations, -decelerations  Consults: None  Significant Findings/ Diagnostic Studies: labs:   Results for Tanya Mccarthy, Tanya Mccarthy (MRN [id number]) as of 07/28/2018 11:22  Ref. Range 07/27/2018 15:13 07/27/2018 15:17 07/27/2018 15:39 07/27/2018 19:15 07/27/2018 19:38 07/27/2018 22:17 07/27/2018 22:19 07/28/2018 04:56  BASIC METABOLIC PANEL Unknown  Rpt (A)      Rpt (A)  Sodium Latest Ref Range: 135 - 145 mmol/L  131 (L)      133 (L)  Potassium Latest Ref Range: 3.5 - 5.1 mmol/L  3.9      3.6  Chloride Latest Ref Range: 98  - 111 mmol/L  107      108  CO2 Latest Ref Range: 22 - 32 mmol/L  15 (L)      16 (L)  Glucose Latest Ref Range: 70 - 99 mg/dL  409103 (H)      811117 (H)  BUN Latest Ref Range: 6 - 20 mg/dL  9      9  Creatinine Latest Ref Range: 0.44 - 1.00 mg/dL  9.140.54      7.820.55  Calcium Latest Ref Range: 8.9 - 10.3 mg/dL  9.2  8.8 (L)  Anion gap Latest Ref Range: 5 - 15   9      9   Alkaline Phosphatase Latest Ref Range: 38 - 126 U/L  118        Albumin Latest Ref Range: 3.5 - 5.0 g/dL  3.3 (L)        Lipase Latest Ref Range: 11 - 51 U/L  20        AST Latest Ref Range: 15 - 41 U/L  37        ALT Latest Ref Range: 0 - 44 U/L  29        Total Protein Latest Ref Range: 6.5 - 8.1 g/dL  7.0        Bilirubin, Direct Latest Ref Range: 0.0 - 0.2 mg/dL  0.2        Indirect Bilirubin Latest Ref Range: 0.3 - 0.9 mg/dL  0.7        Total Bilirubin Latest Ref Range: 0.3 - 1.2 mg/dL  0.9        GFR, Est Non African American Latest Ref Range: >60 mL/min  >60      >60  GFR, Est African American Latest Ref Range: >60 mL/min  >60      >60  Troponin I Latest Ref Range: <0.03 ng/mL  <0.03        WBC Latest Ref Range: 4.0 - 10.5 K/uL  20.9 (H)      18.4 (H)  RBC Latest Ref Range: 3.87 - 5.11 MIL/uL  4.73      4.38  Hemoglobin Latest Ref Range: 12.0 - 15.0 g/dL  16.113.6      09.612.4  HCT Latest Ref Range: 36.0 - 46.0 %  41.4      37.4  MCV Latest Ref Range: 80.0 - 100.0 fL  87.5      85.4  MCH Latest Ref Range: 26.0 - 34.0 pg  28.8      28.3  MCHC Latest Ref Range: 30.0 - 36.0 g/dL  04.532.9      40.933.2  RDW Latest Ref Range: 11.5 - 15.5 %  13.7      13.6  Platelets Latest Ref Range: 150 - 400 K/uL  203      184  nRBC Latest Ref Range: 0.0 - 0.2 %  0.0      0.0  Appearance Latest Ref Range: CLEAR     HAZY (A)      Bilirubin Urine Latest Ref Range: NEGATIVE     NEGATIVE      Color, Urine Latest Ref Range: YELLOW     YELLOW (A)      Glucose, UA Latest Ref Range: NEGATIVE mg/dL    NEGATIVE      Hgb urine dipstick Latest Ref Range:  NEGATIVE     SMALL (A)      Ketones, ur Latest Ref Range: NEGATIVE mg/dL    80 (A)      Nitrite Latest Ref Range: NEGATIVE     NEGATIVE      pH Latest Ref Range: 5.0 - 8.0     5.0      Protein Latest Ref Range: NEGATIVE mg/dL    811100 (A)      Specific Gravity, Urine Latest Ref Range: 1.005 - 1.030     1.025      Amorphous Crystal Unknown    PRESENT      Bacteria, UA Latest Ref Range: NONE SEEN     FEW (A)  Mucus Unknown    PRESENT      RBC / HPF Latest Ref Range: 0 - 5 RBC/hpf    11-20      Squamous Epithelial / LPF Latest Ref Range: 0 - 5     6-10      WBC, UA Latest Ref Range: 0 - 5 WBC/hpf    11-20      CULTURE, BLOOD (ROUTINE X 2) W REFLEX TO ID PANEL Unknown      Rpt Rpt   URINE CULTURE Unknown    Rpt      DG CHEST 2 VIEW Unknown   Rpt       EKG 12-LEAD Unknown Rpt         ED EKG Unknown Rpt         Leukocytes,Ua Latest Ref Range: NEGATIVE     NEGATIVE      US ABDOMEN LIMITED RUQ Unknown     Rpt      Urine and blood cultures pending  Procedures: NST  Hospital Course: The patient was admitted to Labor and Delivery Triage for observation.   Discharge Condition: good  Disposition: Discharge disposition: 01-Home or Self Care       Diet: bland diet and advance as tolerated  Discharge Activity: Activity as tolerated  Discharge Instructions    Discharge activity:   Complete by:  As directed    Activity as tolerated   Discharge diet:  No restrictions   Complete by:  As directed    Bland diet and advance diet as tolerated   Fetal Kick Count:  Lie on our left side for one hour after a meal, and count the number of times your baby kicks.  If it is less than 5 times, get up, move around and drink some juice.  Repeat the test 30 minutes later.  If it is still less than 5 kicks in an hour, notify your doctor.   Complete by:  As directed    LABOR:  When conractions begin, you should start to time them from the beginning of one contraction to the beginning  of the next.  When  contractions are 5 - 10 minutes apart or less and have been regular for at least an hour, you should call your health care provider.   Complete by:  As directed    No sexual activity restrictions   Complete by:  As directed    Notify physician for bleeding from the vagina   Complete by:  As directed    Notify physician for blurring of vision or spots before the eyes   Complete by:  As directed    Notify physician for chills or fever   Complete by:  As directed    Notify physician for fainting spells, "black outs" or loss of consciousness   Complete by:  As directed    Notify physician for increase in vaginal discharge   Complete by:  As directed    Notify physician for leaking of fluid   Complete by:  As directed    Notify physician for pain or burning when urinating   Complete by:  As directed    Notify physician for pelvic pressure (sudden increase)   Complete by:  As directed    Notify physician for severe or continued nausea or vomiting   Complete by:  As directed    Notify physician for sudden gushing of fluid from the vagina (with or without continued leaking)   Complete by:  As  directed    Notify physician for sudden, constant, or occasional abdominal pain   Complete by:  As directed    Notify physician if baby moving less than usual   Complete by:  As directed      Allergies as of 07/28/2018      Reactions   Zoloft [sertraline Hcl] Swelling   Sulfa Antibiotics       Medication List    STOP taking these medications   Doxylamine-Pyridoxine 10-10 MG Tbec     TAKE these medications   cyclobenzaprine 10 MG tablet Commonly known as:  FLEXERIL Take 1 tablet (10 mg total) by mouth 3 (three) times daily as needed for muscle spasms.   multivitamin-prenatal 27-0.8 MG Tabs tablet Take 1 tablet by mouth daily at 12 noon.   ondansetron 4 MG tablet Commonly known as:  ZOFRAN Take 4 mg by mouth every 8 (eight) hours as needed for nausea or vomiting.   promethazine 25 MG  tablet Commonly known as:  PHENERGAN Take 1 tablet (25 mg total) by mouth every 6 (six) hours as needed for nausea or vomiting.     Take Cephalexin as prescribed by [facility]  Follow-up Information    [facility]. Schedule an appointment as soon as possible for a visit.   Specialty:  Obstetrics and Gynecology Why:  for routine prenatal care Contact information: [address] [place] [address] [phone]          Total time spent taking care of this patient: 20 minutes  Signed: Tresea Mall, CNM  07/28/2018, 11:39 AM

## 2018-07-28 NOTE — Discharge Instructions (Signed)
Abdominal Pain During Pregnancy ° °Abdominal pain is common during pregnancy, and has many possible causes. Some causes are more serious than others, and sometimes the cause is not known. Abdominal pain can be a sign that labor is starting. It can also be caused by normal growth and stretching of muscles and ligaments during pregnancy. Always tell your health care provider if you have any abdominal pain. °Follow these instructions at home: °· Do not have sex or put anything in your vagina until your pain goes away completely. °· Get plenty of rest until your pain improves. °· Drink enough fluid to keep your urine pale yellow. °· Take over-the-counter and prescription medicines only as told by your health care provider. °· Keep all follow-up visits as told by your health care provider. This is important. °Contact a health care provider if: °· Your pain continues or gets worse after resting. °· You have lower abdominal pain that: °? Comes and goes at regular intervals. °? Spreads to your back. °? Is similar to menstrual cramps. °· You have pain or burning when you urinate. °Get help right away if: °· You have a fever or chills. °· You have vaginal bleeding. °· You are leaking fluid from your vagina. °· You are passing tissue from your vagina. °· You have vomiting or diarrhea that lasts for more than 24 hours. °· Your baby is moving less than usual. °· You feel very weak or faint. °· You have shortness of breath. °· You develop severe pain in your upper abdomen. °Summary °· Abdominal pain is common during pregnancy, and has many possible causes. °· If you experience abdominal pain during pregnancy, tell your health care provider right away. °· Follow your health care provider's home care instructions and keep all follow-up visits as directed. °This information is not intended to replace advice given to you by your health care provider. Make sure you discuss any questions you have with your health care  provider. °Document Released: 05/26/2005 Document Revised: 08/28/2016 Document Reviewed: 08/28/2016 °Elsevier Interactive Patient Education © 2019 [employer] ° °

## 2018-07-29 ENCOUNTER — Other Ambulatory Visit: Payer: Self-pay | Admitting: Obstetrics and Gynecology

## 2018-07-29 DIAGNOSIS — K2100 Gastro-esophageal reflux disease with esophagitis, without bleeding: Secondary | ICD-10-CM

## 2018-07-29 LAB — URINE CULTURE: Culture: NO GROWTH

## 2018-07-29 LAB — HIV ANTIBODY (ROUTINE TESTING W REFLEX): HIV Screen 4th Generation wRfx: NONREACTIVE

## 2018-07-29 MED ORDER — SUCRALFATE 1 G PO TABS: 1.0000 g | ORAL_TABLET | Freq: Three times a day (TID) | ORAL | 11 refills | Status: DC

## 2018-07-29 NOTE — Progress Notes (Signed)
Patient feeling better, no more nausea or vomiting. Rx for carafate sent.

## 2018-08-01 LAB — CULTURE, BLOOD (ROUTINE X 2)
CULTURE: NO GROWTH
Culture: NO GROWTH
Special Requests: ADEQUATE

## 2018-08-04 ENCOUNTER — Ambulatory Visit (INDEPENDENT_AMBULATORY_CARE_PROVIDER_SITE_OTHER): Payer: Medicaid Other | Admitting: Maternal Newborn

## 2018-08-04 ENCOUNTER — Encounter: Payer: Self-pay | Admitting: Maternal Newborn

## 2018-08-04 VITALS — BP 118/74 | Wt 170.0 lb

## 2018-08-04 DIAGNOSIS — Z3480 Encounter for supervision of other normal pregnancy, unspecified trimester: Secondary | ICD-10-CM

## 2018-08-04 DIAGNOSIS — Z3A38 38 weeks gestation of pregnancy: Secondary | ICD-10-CM

## 2018-08-04 DIAGNOSIS — Z3483 Encounter for supervision of other normal pregnancy, third trimester: Secondary | ICD-10-CM

## 2018-08-04 LAB — POCT URINALYSIS DIPSTICK OB
Glucose, UA: NEGATIVE
PROTEIN: NEGATIVE

## 2018-08-04 NOTE — Patient Instructions (Signed)
Third Trimester of Pregnancy The third trimester is from week 28 through week 40 (months 7 through 9). The third trimester is a time when the unborn baby (fetus) is growing rapidly. At the end of the ninth month, the fetus is about 20 inches in length and weighs 6-10 pounds. Body changes during your third trimester Your body will continue to go through many changes during pregnancy. The changes vary from woman to woman. During the third trimester:  Your weight will continue to increase. You can expect to gain 25-35 pounds (11-16 kg) by the end of the pregnancy.  You may begin to get stretch marks on your hips, abdomen, and breasts.  You may urinate more often because the fetus is moving lower into your pelvis and pressing on your bladder.  You may develop or continue to have heartburn. This is caused by increased hormones that slow down muscles in the digestive tract.  You may develop or continue to have constipation because increased hormones slow digestion and cause the muscles that push waste through your intestines to relax.  You may develop hemorrhoids. These are swollen veins (varicose veins) in the rectum that can itch or be painful.  You may develop swollen, bulging veins (varicose veins) in your legs.  You may have increased body aches in the pelvis, back, or thighs. This is due to weight gain and increased hormones that are relaxing your joints.  You may have changes in your hair. These can include thickening of your hair, rapid growth, and changes in texture. Some women also have hair loss during or after pregnancy, or hair that feels dry or thin. Your hair will most likely return to normal after your baby is born.  Your breasts will continue to grow and they will continue to become tender. A yellow fluid (colostrum) may leak from your breasts. This is the first milk you are producing for your baby.  Your belly button may stick out.  You may notice more swelling in your hands,  face, or ankles.  You may have increased tingling or numbness in your hands, arms, and legs. The skin on your belly may also feel numb.  You may feel short of breath because of your expanding uterus.  You may have more problems sleeping. This can be caused by the size of your belly, increased need to urinate, and an increase in your body's metabolism.  You may notice the fetus "dropping," or moving lower in your abdomen (lightening).  You may have increased vaginal discharge.  You may notice your joints feel loose and you may have pain around your pelvic bone. What to expect at prenatal visits You will have prenatal exams every 2 weeks until week 36. Then you will have weekly prenatal exams. During a routine prenatal visit:  You will be weighed to make sure you and the baby are growing normally.  Your blood pressure will be taken.  Your abdomen will be measured to track your baby's growth.  The fetal heartbeat will be listened to.  Any test results from the previous visit will be discussed.  You may have a cervical check near your due date to see if your cervix has softened or thinned (effaced).  You will be tested for Group B streptococcus. This happens between 35 and 37 weeks. Your health care provider may ask you:  What your birth plan is.  How you are feeling.  If you are feeling the baby move.  If you have had any abnormal   symptoms, such as leaking fluid, bleeding, severe headaches, or abdominal cramping.  If you are using any tobacco products, including cigarettes, chewing tobacco, and electronic cigarettes.  If you have any questions. Other tests or screenings that may be performed during your third trimester include:  Blood tests that check for low iron levels (anemia).  Fetal testing to check the health, activity level, and growth of the fetus. Testing is done if you have certain medical conditions or if there are problems during the pregnancy.  Nonstress test  (NST). This test checks the health of your baby to make sure there are no signs of problems, such as the baby not getting enough oxygen. During this test, a belt is placed around your belly. The baby is made to move, and its heart rate is monitored during movement. What is false labor? False labor is a condition in which you feel small, irregular tightenings of the muscles in the womb (contractions) that usually go away with rest, changing position, or drinking water. These are called Braxton Hicks contractions. Contractions may last for hours, days, or even weeks before true labor sets in. If contractions come at regular intervals, become more frequent, increase in intensity, or become painful, you should see your health care provider. What are the signs of labor?  Abdominal cramps.  Regular contractions that start at 10 minutes apart and become stronger and more frequent with time.  Contractions that start on the top of the uterus and spread down to the lower abdomen and back.  Increased pelvic pressure and dull back pain.  A watery or bloody mucus discharge that comes from the vagina.  Leaking of amniotic fluid. This is also known as your "water breaking." It could be a slow trickle or a gush. Let your health care provider know if it has a color or strange odor. If you have any of these signs, call your health care provider right away, even if it is before your due date. Follow these instructions at home: Medicines  Follow your health care provider's instructions regarding medicine use. Specific medicines may be either safe or unsafe to take during pregnancy.  Take a prenatal vitamin that contains at least 600 micrograms (mcg) of folic acid.  If you develop constipation, try taking a stool softener if your health care provider approves. Eating and drinking   Eat a balanced diet that includes fresh fruits and vegetables, whole grains, good sources of protein such as meat, eggs, or tofu,  and low-fat dairy. Your health care provider will help you determine the amount of weight gain that is right for you.  Avoid raw meat and uncooked cheese. These carry germs that can cause birth defects in the baby.  If you have low calcium intake from food, talk to your health care provider about whether you should take a daily calcium supplement.  Eat four or five small meals rather than three large meals a day.  Limit foods that are high in fat and processed sugars, such as fried and sweet foods.  To prevent constipation: ? Drink enough fluid to keep your urine clear or pale yellow. ? Eat foods that are high in fiber, such as fresh fruits and vegetables, whole grains, and beans. Activity  Exercise only as directed by your health care provider. Most women can continue their usual exercise routine during pregnancy. Try to exercise for 30 minutes at least 5 days a week. Stop exercising if you experience uterine contractions.  Avoid heavy lifting.  Do   not exercise in extreme heat or humidity, or at high altitudes.  Wear low-heel, comfortable shoes.  Practice good posture.  You may continue to have sex unless your health care provider tells you otherwise. Relieving pain and discomfort  Take frequent breaks and rest with your legs elevated if you have leg cramps or low back pain.  Take warm sitz baths to soothe any pain or discomfort caused by hemorrhoids. Use hemorrhoid cream if your health care provider approves.  Wear a good support bra to prevent discomfort from breast tenderness.  If you develop varicose veins: ? Wear support pantyhose or compression stockings as told by your healthcare provider. ? Elevate your feet for 15 minutes, 3-4 times a day. Prenatal care  Write down your questions. Take them to your prenatal visits.  Keep all your prenatal visits as told by your health care provider. This is important. Safety  Wear your seat belt at all times when driving.  Make  a list of emergency phone numbers, including numbers for family, friends, the hospital, and police and fire departments. General instructions  Avoid cat litter boxes and soil used by cats. These carry germs that can cause birth defects in the baby. If you have a cat, ask someone to clean the litter box for you.  Do not travel far distances unless it is absolutely necessary and only with the approval of your health care provider.  Do not use hot tubs, steam rooms, or saunas.  Do not drink alcohol.  Do not use any products that contain nicotine or tobacco, such as cigarettes and e-cigarettes. If you need help quitting, ask your health care provider.  Do not use any medicinal herbs or unprescribed drugs. These chemicals affect the formation and growth of the baby.  Do not douche or use tampons or scented sanitary pads.  Do not cross your legs for long periods of time.  To prepare for the arrival of your baby: ? Take prenatal classes to understand, practice, and ask questions about labor and delivery. ? Make a trial run to the hospital. ? Visit the hospital and tour the maternity area. ? Arrange for maternity or paternity leave through employers. ? Arrange for family and friends to take care of pets while you are in the hospital. ? Purchase a rear-facing car seat and make sure you know how to install it in your car. ? Pack your hospital bag. ? Prepare the baby's nursery. Make sure to remove all pillows and stuffed animals from the baby's crib to prevent suffocation.  Visit your dentist if you have not gone during your pregnancy. Use a soft toothbrush to brush your teeth and be gentle when you floss. Contact a health care provider if:  You are unsure if you are in labor or if your water has broken.  You become dizzy.  You have mild pelvic cramps, pelvic pressure, or nagging pain in your abdominal area.  You have lower back pain.  You have persistent nausea, vomiting, or  diarrhea.  You have an unusual or bad smelling vaginal discharge.  You have pain when you urinate. Get help right away if:  Your water breaks before 37 weeks.  You have regular contractions less than 5 minutes apart before 37 weeks.  You have a fever.  You are leaking fluid from your vagina.  You have spotting or bleeding from your vagina.  You have severe abdominal pain or cramping.  You have rapid weight loss or weight gain.  You have   shortness of breath with chest pain.  You notice sudden or extreme swelling of your face, hands, ankles, feet, or legs.  Your baby makes fewer than 10 movements in 2 hours.  You have severe headaches that do not go away when you take medicine.  You have vision changes. Summary  The third trimester is from week 28 through week 40, months 7 through 9. The third trimester is a time when the unborn baby (fetus) is growing rapidly.  During the third trimester, your discomfort may increase as you and your baby continue to gain weight. You may have abdominal, leg, and back pain, sleeping problems, and an increased need to urinate.  During the third trimester your breasts will keep growing and they will continue to become tender. A yellow fluid (colostrum) may leak from your breasts. This is the first milk you are producing for your baby.  False labor is a condition in which you feel small, irregular tightenings of the muscles in the womb (contractions) that eventually go away. These are called Braxton Hicks contractions. Contractions may last for hours, days, or even weeks before true labor sets in.  Signs of labor can include: abdominal cramps; regular contractions that start at 10 minutes apart and become stronger and more frequent with time; watery or bloody mucus discharge that comes from the vagina; increased pelvic pressure and dull back pain; and leaking of amniotic fluid. This information is not intended to replace advice given to you by your  health care provider. Make sure you discuss any questions you have with your health care provider. Document Released: 05/20/2001 Document Revised: 07/01/2016 Document Reviewed: 07/01/2016 Elsevier Interactive Patient Education  2019 [employer]  

## 2018-08-04 NOTE — Progress Notes (Signed)
No vb. No lof.  

## 2018-08-04 NOTE — Progress Notes (Signed)
    Routine Prenatal Care Visit  Subjective  Tanya Mccarthy is a 28 y.o. [phone] at [redacted]w[redacted]d being seen today for ongoing prenatal care.  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  ----------------------------------------------------------------------------------- Patient reports no complaints. Appetite has returned and she is not having pain. Occasional Braxton-Hicks contractions. Contractions: Not present. Vag. Bleeding: None.  Movement: Present. No leaking of fluid.  ----------------------------------------------------------------------------------- The following portions of the patient's history were reviewed and updated as appropriate: allergies, current medications, past family history, past medical history, past social history, past surgical history and problem list. Problem list updated.  Objective  Blood pressure 118/74, weight 170 lb (77.1 kg), last menstrual period 10/26/2017. Pregravid weight 151 lb (68.5 kg) Total Weight Gain 19 lb (8.618 kg) Body mass index is 27.44 kg/m.   Urinalysis: Urine dipstick shows negative for glucose, protein.  Fetal Status: Fetal Heart Rate (bpm): 119 Fundal Height: 37 cm Movement: Present     General:  Alert, oriented and cooperative. Patient is in no acute distress.  Skin: Skin is warm and dry. No rash noted.   Cardiovascular: Normal heart rate noted  Respiratory: Normal respiratory effort, no problems with respiration noted  Abdomen: Soft, gravid, appropriate for gestational age. Pain/Pressure: Present     Pelvic:  Cervical exam deferred        Extremities: Normal range of motion.  Edema: None  Mental Status: Normal mood and affect. Normal behavior. Normal judgment and thought content.    Assessment   27 y.o.  [id number] at [redacted]w[redacted]d, EDD 08/18/2018 by Ultrasound presenting for a routine prenatal visit.  Plan   FOURTH Problems (from 12/29/17 to present)    Problem Noted Resolved   Supervision of other normal pregnancy, antepartum 12/29/2017 by Oswaldo Conroy, CNM No   Overview Addendum 07/05/2018 10:07 AM by Vena Austria, MD    [facility] Prenatal Labs  Dating 8 wk [place] Blood type: A/Positive/-- (07/23 1102)   Genetic Screen  NIPS: diploid XY Antibody:Negative (07/23 1102)  Anatomic [place] Normal female anatomy, posterior placenta Rubella: 1.41 (07/23 1102) Varicella: Immune  GTT Third trimester: 98 RPR: Non Reactive (07/23 1102)   Rhogam N/A HBsAg: Negative (07/23 1102)   TDaP vaccine        06/21/18               Flu Shot: 03/27/2019 HIV: Non Reactive (07/23 1102)   Baby Food          Breast                      GBS:   Contraception Vasectomy/ condoms Pap: 01/08/2017, NILM  CBB   Pelvis tested to 8lbs 6oz  CS/VBAC    Support Person                  Term labor symptoms and general obstetric precautions including but not limited to vaginal bleeding, contractions, leaking of fluid and fetal movement were reviewed.  Please refer to After Visit Summary for other counseling recommendations.   Return in about 1 week (around 08/11/2018) for ROB.  Marcelyn Bruins, CNM 08/04/2018

## 2018-08-10 ENCOUNTER — Ambulatory Visit (INDEPENDENT_AMBULATORY_CARE_PROVIDER_SITE_OTHER): Payer: Medicaid Other | Admitting: Maternal Newborn

## 2018-08-10 VITALS — BP 102/68 | Wt 164.0 lb

## 2018-08-10 DIAGNOSIS — Z3480 Encounter for supervision of other normal pregnancy, unspecified trimester: Secondary | ICD-10-CM

## 2018-08-10 DIAGNOSIS — Z3483 Encounter for supervision of other normal pregnancy, third trimester: Secondary | ICD-10-CM

## 2018-08-10 DIAGNOSIS — Z3A38 38 weeks gestation of pregnancy: Secondary | ICD-10-CM

## 2018-08-10 NOTE — Patient Instructions (Signed)
Third Trimester of Pregnancy The third trimester is from week 28 through week 40 (months 7 through 9). The third trimester is a time when the unborn baby (fetus) is growing rapidly. At the end of the ninth month, the fetus is about 20 inches in length and weighs 6-10 pounds. Body changes during your third trimester Your body will continue to go through many changes during pregnancy. The changes vary from woman to woman. During the third trimester:  Your weight will continue to increase. You can expect to gain 25-35 pounds (11-16 kg) by the end of the pregnancy.  You may begin to get stretch marks on your hips, abdomen, and breasts.  You may urinate more often because the fetus is moving lower into your pelvis and pressing on your bladder.  You may develop or continue to have heartburn. This is caused by increased hormones that slow down muscles in the digestive tract.  You may develop or continue to have constipation because increased hormones slow digestion and cause the muscles that push waste through your intestines to relax.  You may develop hemorrhoids. These are swollen veins (varicose veins) in the rectum that can itch or be painful.  You may develop swollen, bulging veins (varicose veins) in your legs.  You may have increased body aches in the pelvis, back, or thighs. This is due to weight gain and increased hormones that are relaxing your joints.  You may have changes in your hair. These can include thickening of your hair, rapid growth, and changes in texture. Some women also have hair loss during or after pregnancy, or hair that feels dry or thin. Your hair will most likely return to normal after your baby is born.  Your breasts will continue to grow and they will continue to become tender. A yellow fluid (colostrum) may leak from your breasts. This is the first milk you are producing for your baby.  Your belly button may stick out.  You may notice more swelling in your hands,  face, or ankles.  You may have increased tingling or numbness in your hands, arms, and legs. The skin on your belly may also feel numb.  You may feel short of breath because of your expanding uterus.  You may have more problems sleeping. This can be caused by the size of your belly, increased need to urinate, and an increase in your body's metabolism.  You may notice the fetus "dropping," or moving lower in your abdomen (lightening).  You may have increased vaginal discharge.  You may notice your joints feel loose and you may have pain around your pelvic bone. What to expect at prenatal visits You will have prenatal exams every 2 weeks until week 36. Then you will have weekly prenatal exams. During a routine prenatal visit:  You will be weighed to make sure you and the baby are growing normally.  Your blood pressure will be taken.  Your abdomen will be measured to track your baby's growth.  The fetal heartbeat will be listened to.  Any test results from the previous visit will be discussed.  You may have a cervical check near your due date to see if your cervix has softened or thinned (effaced).  You will be tested for Group B streptococcus. This happens between 35 and 37 weeks. Your health care provider may ask you:  What your birth plan is.  How you are feeling.  If you are feeling the baby move.  If you have had any abnormal   symptoms, such as leaking fluid, bleeding, severe headaches, or abdominal cramping.  If you are using any tobacco products, including cigarettes, chewing tobacco, and electronic cigarettes.  If you have any questions. Other tests or screenings that may be performed during your third trimester include:  Blood tests that check for low iron levels (anemia).  Fetal testing to check the health, activity level, and growth of the fetus. Testing is done if you have certain medical conditions or if there are problems during the pregnancy.  Nonstress test  (NST). This test checks the health of your baby to make sure there are no signs of problems, such as the baby not getting enough oxygen. During this test, a belt is placed around your belly. The baby is made to move, and its heart rate is monitored during movement. What is false labor? False labor is a condition in which you feel small, irregular tightenings of the muscles in the womb (contractions) that usually go away with rest, changing position, or drinking water. These are called Braxton Hicks contractions. Contractions may last for hours, days, or even weeks before true labor sets in. If contractions come at regular intervals, become more frequent, increase in intensity, or become painful, you should see your health care provider. What are the signs of labor?  Abdominal cramps.  Regular contractions that start at 10 minutes apart and become stronger and more frequent with time.  Contractions that start on the top of the uterus and spread down to the lower abdomen and back.  Increased pelvic pressure and dull back pain.  A watery or bloody mucus discharge that comes from the vagina.  Leaking of amniotic fluid. This is also known as your "water breaking." It could be a slow trickle or a gush. Let your health care provider know if it has a color or strange odor. If you have any of these signs, call your health care provider right away, even if it is before your due date. Follow these instructions at home: Medicines  Follow your health care provider's instructions regarding medicine use. Specific medicines may be either safe or unsafe to take during pregnancy.  Take a prenatal vitamin that contains at least 600 micrograms (mcg) of folic acid.  If you develop constipation, try taking a stool softener if your health care provider approves. Eating and drinking   Eat a balanced diet that includes fresh fruits and vegetables, whole grains, good sources of protein such as meat, eggs, or tofu,  and low-fat dairy. Your health care provider will help you determine the amount of weight gain that is right for you.  Avoid raw meat and uncooked cheese. These carry germs that can cause birth defects in the baby.  If you have low calcium intake from food, talk to your health care provider about whether you should take a daily calcium supplement.  Eat four or five small meals rather than three large meals a day.  Limit foods that are high in fat and processed sugars, such as fried and sweet foods.  To prevent constipation: ? Drink enough fluid to keep your urine clear or pale yellow. ? Eat foods that are high in fiber, such as fresh fruits and vegetables, whole grains, and beans. Activity  Exercise only as directed by your health care provider. Most women can continue their usual exercise routine during pregnancy. Try to exercise for 30 minutes at least 5 days a week. Stop exercising if you experience uterine contractions.  Avoid heavy lifting.  Do   not exercise in extreme heat or humidity, or at high altitudes.  Wear low-heel, comfortable shoes.  Practice good posture.  You may continue to have sex unless your health care provider tells you otherwise. Relieving pain and discomfort  Take frequent breaks and rest with your legs elevated if you have leg cramps or low back pain.  Take warm sitz baths to soothe any pain or discomfort caused by hemorrhoids. Use hemorrhoid cream if your health care provider approves.  Wear a good support bra to prevent discomfort from breast tenderness.  If you develop varicose veins: ? Wear support pantyhose or compression stockings as told by your healthcare provider. ? Elevate your feet for 15 minutes, 3-4 times a day. Prenatal care  Write down your questions. Take them to your prenatal visits.  Keep all your prenatal visits as told by your health care provider. This is important. Safety  Wear your seat belt at all times when driving.  Make  a list of emergency phone numbers, including numbers for family, friends, the hospital, and police and fire departments. General instructions  Avoid cat litter boxes and soil used by cats. These carry germs that can cause birth defects in the baby. If you have a cat, ask someone to clean the litter box for you.  Do not travel far distances unless it is absolutely necessary and only with the approval of your health care provider.  Do not use hot tubs, steam rooms, or saunas.  Do not drink alcohol.  Do not use any products that contain nicotine or tobacco, such as cigarettes and e-cigarettes. If you need help quitting, ask your health care provider.  Do not use any medicinal herbs or unprescribed drugs. These chemicals affect the formation and growth of the baby.  Do not douche or use tampons or scented sanitary pads.  Do not cross your legs for long periods of time.  To prepare for the arrival of your baby: ? Take prenatal classes to understand, practice, and ask questions about labor and delivery. ? Make a trial run to the hospital. ? Visit the hospital and tour the maternity area. ? Arrange for maternity or paternity leave through employers. ? Arrange for family and friends to take care of pets while you are in the hospital. ? Purchase a rear-facing car seat and make sure you know how to install it in your car. ? Pack your hospital bag. ? Prepare the baby's nursery. Make sure to remove all pillows and stuffed animals from the baby's crib to prevent suffocation.  Visit your dentist if you have not gone during your pregnancy. Use a soft toothbrush to brush your teeth and be gentle when you floss. Contact a health care provider if:  You are unsure if you are in labor or if your water has broken.  You become dizzy.  You have mild pelvic cramps, pelvic pressure, or nagging pain in your abdominal area.  You have lower back pain.  You have persistent nausea, vomiting, or  diarrhea.  You have an unusual or bad smelling vaginal discharge.  You have pain when you urinate. Get help right away if:  Your water breaks before 37 weeks.  You have regular contractions less than 5 minutes apart before 37 weeks.  You have a fever.  You are leaking fluid from your vagina.  You have spotting or bleeding from your vagina.  You have severe abdominal pain or cramping.  You have rapid weight loss or weight gain.  You have   shortness of breath with chest pain.  You notice sudden or extreme swelling of your face, hands, ankles, feet, or legs.  Your baby makes fewer than 10 movements in 2 hours.  You have severe headaches that do not go away when you take medicine.  You have vision changes. Summary  The third trimester is from week 28 through week 40, months 7 through 9. The third trimester is a time when the unborn baby (fetus) is growing rapidly.  During the third trimester, your discomfort may increase as you and your baby continue to gain weight. You may have abdominal, leg, and back pain, sleeping problems, and an increased need to urinate.  During the third trimester your breasts will keep growing and they will continue to become tender. A yellow fluid (colostrum) may leak from your breasts. This is the first milk you are producing for your baby.  False labor is a condition in which you feel small, irregular tightenings of the muscles in the womb (contractions) that eventually go away. These are called Braxton Hicks contractions. Contractions may last for hours, days, or even weeks before true labor sets in.  Signs of labor can include: abdominal cramps; regular contractions that start at 10 minutes apart and become stronger and more frequent with time; watery or bloody mucus discharge that comes from the vagina; increased pelvic pressure and dull back pain; and leaking of amniotic fluid. This information is not intended to replace advice given to you by your  health care provider. Make sure you discuss any questions you have with your health care provider. Document Released: 05/20/2001 Document Revised: 07/01/2016 Document Reviewed: 07/01/2016 Elsevier Interactive Patient Education  2019 [employer]  

## 2018-08-10 NOTE — Progress Notes (Signed)
ROB No lof, no vb, and good FM

## 2018-08-10 NOTE — Progress Notes (Signed)
    Routine Prenatal Care Visit  Subjective  Tanya Mccarthy is a 28 y.o. [phone] at [redacted]w[redacted]d being seen today for ongoing prenatal care.  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  ----------------------------------------------------------------------------------- Patient reports no complaints.   Contractions: Not present. Vag. Bleeding: None.  Movement: Present. No leaking of fluid.  ----------------------------------------------------------------------------------- The following portions of the patient's history were reviewed and updated as appropriate: allergies, current medications, past family history, past medical history, past social history, past surgical history and problem list. Problem list updated.  Objective  Blood pressure 102/68, weight 164 lb (74.4 kg), last menstrual period 10/26/2017. Pregravid weight 151 lb (68.5 kg) Total Weight Gain 13 lb (5.897 kg) Body mass index is 26.47 kg/m.   Fetal Status: Fetal Heart Rate (bpm): 124 Fundal Height: 38 cm Movement: Present     General:  Alert, oriented and cooperative. Patient is in no acute distress.  Skin: Skin is warm and dry. No rash noted.   Cardiovascular: Normal heart rate noted  Respiratory: Normal respiratory effort, no problems with respiration noted  Abdomen: Soft, gravid, appropriate for gestational age. Pain/Pressure: Present     Pelvic:  Cervical exam deferred        Extremities: Normal range of motion.  Edema: None  Mental Status: Normal mood and affect. Normal behavior. Normal judgment and thought content.     Assessment   27 y.o. [id number] at [redacted]w[redacted]d, EDD 08/18/2018 by Ultrasound presenting for a routine prenatal visit.  Plan   FOURTH Problems (from 12/29/17 to present)    Problem  Noted Resolved   Supervision of other normal pregnancy, antepartum 12/29/2017 by Oswaldo Conroy, CNM No   Overview Addendum 07/05/2018 10:07 AM by Vena Austria, MD    [facility] Prenatal Labs  Dating 8 wk [place] Blood type: A/Positive/-- (07/23 1102)   Genetic Screen  NIPS: diploid XY Antibody:Negative (07/23 1102)  Anatomic [place] Normal female anatomy, posterior placenta Rubella: 1.41 (07/23 1102) Varicella: Immune  GTT Third trimester: 98 RPR: Non Reactive (07/23 1102)   Rhogam N/A HBsAg: Negative (07/23 1102)   TDaP vaccine        06/21/18               Flu Shot: 03/27/2019 HIV: Non Reactive (07/23 1102)   Baby Food          Breast                      GBS:   Contraception Vasectomy/ condoms Pap: 01/08/2017, NILM  CBB   Pelvis tested to 8lbs 6oz  CS/VBAC    Support Person               Discussed preferences for postdates management. She was induced with her other births. She prefers an induction on 3/16, or as soon as available during that week if possible. Will schedule  and inform her of date/time.  Term labor symptoms and general obstetric precautions including but not limited to vaginal bleeding, contractions, leaking of fluid and fetal movement were reviewed.  Please refer to After Visit Summary for other counseling recommendations.   Return in about 1 week (around 08/17/2018) for ROB.  Marcelyn Bruins, CNM 08/10/2018

## 2018-08-17 ENCOUNTER — Ambulatory Visit (INDEPENDENT_AMBULATORY_CARE_PROVIDER_SITE_OTHER): Payer: Medicaid Other | Admitting: Certified Nurse Midwife

## 2018-08-17 ENCOUNTER — Encounter: Payer: Self-pay | Admitting: Certified Nurse Midwife

## 2018-08-17 VITALS — BP 100/60 | Wt 163.0 lb

## 2018-08-17 DIAGNOSIS — Z3685 Encounter for antenatal screening for Streptococcus B: Secondary | ICD-10-CM

## 2018-08-17 DIAGNOSIS — Z3483 Encounter for supervision of other normal pregnancy, third trimester: Secondary | ICD-10-CM

## 2018-08-17 DIAGNOSIS — Z3480 Encounter for supervision of other normal pregnancy, unspecified trimester: Secondary | ICD-10-CM

## 2018-08-17 DIAGNOSIS — Z3A39 39 weeks gestation of pregnancy: Secondary | ICD-10-CM

## 2018-08-17 LAB — POCT URINALYSIS DIPSTICK OB
Glucose, UA: NEGATIVE
POC,PROTEIN,UA: NEGATIVE

## 2018-08-17 NOTE — H&P (Signed)
OB History & Physical   History of Present Illness:  Chief Complaint:  Schedule IOL for postdates HPI:  Tanya Mccarthy is a 28 y.o. [phone] female with EDC=08/18/2018 at [redacted]w[redacted]d dated by an 8 week ultrasound.  Her pregnancy has been complicated by tobacco use in pregnancy and at 37 weeks she was hospitalized with nausea/ vomiting and right sided abdominal pain. .  She presents to [facility] for a ROB and to schedule her induction of labor for postdates. Also needs a GBS culture. She has had irregular contractions and denies vaginal bleeding or leakage of fluid. Baby has been active.   Prenatal care site: Prenatal care at [facility] has also been remarkable for   [facility] Prenatal Labs  Dating 8 wk [place] Blood type: A/Positive/-- (07/23 1102)   Genetic Screen  NIPS: diploid XY Antibody:Negative (07/23 1102)  Anatomic [place] Normal female anatomy, posterior placenta Rubella: 1.41 (07/23 1102) Varicella: Immune  GTT Third trimester: 98 RPR: Non Reactive (07/23 1102)   Rhogam N/A HBsAg: Negative (07/23 1102)   TDaP vaccine        06/21/18               Flu Shot: 03/27/2019 HIV: Non Reactive (07/23 1102)   Baby Food          Breast                      GBS: pending  Contraception Vasectomy/ condoms Pap: 01/08/2017, NILM  CBB   Pelvis tested to 8lbs 6oz  CS/VBAC    Support Person            Maternal Medical History:   Past Medical History:  Diagnosis Date  . Abnormal Pap smear of cervix 2015   +HPV  . History of Papanicolaou smear of cervix 03/03/14; 10/02/15   ASCUS/+HPV; NEG, CT/GC/TR NEG;  . Patient denies medical problems     Past Surgical History:  Procedure Laterality Date  . NO PAST SURGERIES    . WISDOM TOOTH EXTRACTION  2012   ONE    Allergies  Allergen Reactions  . Zoloft [Sertraline Hcl] Swelling  . Sulfa Antibiotics    Current medications: Prenatal Vit-Fe Fumarate-FA (MULTIVITAMIN-PRENATAL) 27-0.8 MG TABS tablet Take 1 tablet by mouth daily at 12 noon.    [provider]          Occasionally will take a Zofran 4 mgm tablet for nausea.    Social History: She  reports that she has been smoking cigarettes. She has been smoking about 1.00 pack per day. She has never used smokeless tobacco. She reports previous alcohol use. She reports that she does not use drugs.  Family History: family history includes Cancer in her paternal grandfather; Cancer (age of onset: 76) in her mother; Cancer (age of onset: 51) in her maternal grandfather; Diabetes in her maternal grandmother.   Review of Systems: Negative x 10 systems reviewed except as noted in the HPI.      Physical Exam:  Vital Signs: BP 100/60   Wt 163 lb (73.9 kg)   LMP 10/26/2017 (Approximate)   BMI 26.31 kg/m  General: gravid WF in  no acute distress.  HEENT: normocephalic, atraumatic Heart: regular rate & rhythm.  No murmurs/rubs/gallops Lungs: clear to auscultation bilaterally Abdomen: soft, gravid, non-tender;  FH 39cm, LOT, EFW: 8# Pelvic:   External: Normal external female genitalia  Cervix: Dilation: 3 / Effacement (%): 60 / Station: -3 / posterior/ medium  Extremities: non-tender, symmetric, no edema bilaterally.  DTRs: +2  Neurologic: Alert & oriented x 3.   Baseline FHR: FHR 131  Assessment:  Tanya Mccarthy is a 28 y.o. [phone] female at [redacted]w[redacted]d scheduled for postdates induction for 17 March 0001 (40wk6days) Bishop score: 5  Plan:  1. Admit to Labor & Delivery .   2. CBC, T&S, Clrs, IVF 3. GBS pending.   4. Plan for cytotec induction if no cervical change or Pitocin if cervix more favorable.. 5. A POS/ RI/ VI 6. TDAP and flu given AP 7. Breast 8. Condoms/ vasectomy  Farrel Conners  08/17/2018 1:45 PM

## 2018-08-17 NOTE — Progress Notes (Signed)
ROB at 39wk6d: Feeling occasional mild contraction. No bleeding or leakage of water. Wants to change induction day from 3/16 at 0800 to 17 March 0001-this was OKd by L&D GBS done Cervix: 3/60%/ballotable/ vertex Labor precautions  Farrel Conners, CNM

## 2018-08-20 ENCOUNTER — Telehealth: Payer: Self-pay

## 2018-08-20 NOTE — Telephone Encounter (Signed)
I talked to [facility] and updated them on the pts labs that they asked for. Thank you

## 2018-08-20 NOTE — Telephone Encounter (Signed)
Pt gave birth at [facility] in [place].  The L&D there needs labs including GBS and certain records.  [phone]

## 2018-08-21 LAB — CULTURE, BETA STREP (GROUP B ONLY): Strep Gp B Culture: NEGATIVE

## 2018-09-16 ENCOUNTER — Ambulatory Visit: Payer: Medicaid Other | Admitting: Maternal Newborn

## 2018-09-28 ENCOUNTER — Encounter: Payer: Self-pay | Admitting: Certified Nurse Midwife

## 2018-09-28 ENCOUNTER — Telehealth: Payer: Self-pay

## 2018-09-28 NOTE — Telephone Encounter (Signed)
Called pt, no answer, did not have option to leave voice msg.

## 2018-09-28 NOTE — Telephone Encounter (Signed)
OK, we are seeing women for their 6 week check ups to see if there are any problems postpartum, screen for depression/anxiety postpartum, help with contraception if needed, do Pap smear if it is time. Please call her and let her know it is up to her whether to come in.

## 2018-09-28 NOTE — Telephone Encounter (Signed)
Pt has 6 wk PP this Friday with JS. She wants to how mandatory is this visit? Please advise in JS absence.

## 2018-10-01 ENCOUNTER — Ambulatory Visit: Payer: Medicaid Other | Admitting: Maternal Newborn

## 2018-10-04 ENCOUNTER — Encounter: Payer: Self-pay | Admitting: Obstetrics and Gynecology

## 2018-10-04 ENCOUNTER — Other Ambulatory Visit: Payer: Self-pay

## 2018-10-04 ENCOUNTER — Ambulatory Visit (INDEPENDENT_AMBULATORY_CARE_PROVIDER_SITE_OTHER): Payer: Medicaid Other | Admitting: Obstetrics and Gynecology

## 2018-10-04 DIAGNOSIS — Z1389 Encounter for screening for other disorder: Secondary | ICD-10-CM | POA: Diagnosis not present

## 2018-10-04 NOTE — Progress Notes (Signed)
Postpartum Visit  Chief Complaint:  Chief Complaint  Patient presents with  . Postpartum Care    Vaginal Delivery 3/13 [place]     History of Present Illness: Patient is a 28 y.o. [id number] presents for postpartum visit.  Date of delivery: 08/20/2018 at [facility] [place] Type of delivery: Vaginal delivery - Vacuum or forceps assisted  no Episiotomy No.  Laceration: yes first degree Pregnancy or labor problems:  no Any problems since the delivery:  no  Breast or formula feeding: plans to breastfeed Post partum depression/anxiety noted:  no Edinburgh Post-Partum Depression Score: 2 Date of last PAP: 01/08/2017  no abnormalities   Review of Systems: Review of Systems  Constitutional: Negative.   Gastrointestinal: Negative.   Genitourinary: Negative.   Psychiatric/Behavioral: Negative.     The following portions of the patient's history were reviewed and updated as appropriate: allergies, current medications, past family history, past medical history, past social history, past surgical history and problem list.  Past Medical History:  Past Medical History:  Diagnosis Date  . Abnormal Pap smear of cervix 2015   +HPV  . History of Papanicolaou smear of cervix 03/03/14; 10/02/15   ASCUS/+HPV; NEG, CT/GC/TR NEG;    Past Surgical History:  Past Surgical History:  Procedure Laterality Date  . NO PAST SURGERIES    . WISDOM TOOTH EXTRACTION  2012   ONE    Family History:  Family History  Problem Relation Age of Onset  . Cancer Mother 20       CERVIX, OVARY  . Diabetes Maternal Grandmother   . Cancer Maternal Grandfather 67       KIDNEY, LIVER, STOMACH  . Colon cancer Paternal Grandfather     Social History:  Social History   Socioeconomic History  . Marital status: Single    Spouse name: Not on file  . Number of children: 2  . Years of education: 49  . Highest education level: Not on file  Occupational History  . Occupation: UNEMPLOYED   Social Needs  . Financial resource strain: Not on file  . Food insecurity:    Worry: Not on file    Inability: Not on file  . Transportation needs:    Medical: Not on file    Non-medical: Not on file  Tobacco Use  . Smoking status: Current Every Day Smoker    Packs/day: 1.00    Types: Cigarettes  . Smokeless tobacco: Never Used  Substance and Sexual Activity  . Alcohol use: Not Currently    Comment: Occ.  . Drug use: No  . Sexual activity: Yes    Birth control/protection: None, Surgical    Comment: Vasectomy  Lifestyle  . Physical activity:    Days per week: Not on file    Minutes per session: Not on file  . Stress: Not on file  Relationships  . Social connections:    Talks on phone: Not on file    Gets together: Not on file    Attends religious service: Not on file    Active member of club or organization: Not on file    Attends meetings of clubs or organizations: Not on file    Relationship status: Not on file  . Intimate partner violence:    Fear of current or ex partner: Not on file    Emotionally abused: Not on file    Physically abused: Not on file    Forced sexual activity: Not on file  Other Topics  Concern  . Not on file  Social History Narrative  . Not on file    Allergies:  Allergies  Allergen Reactions  . Zoloft [Sertraline Hcl] Swelling  . Sulfa Antibiotics     Medications: Prior to Admission medications   Medication Sig Start Date End Date Taking? Authorizing Provider  cyclobenzaprine (FLEXERIL) 10 MG tablet Take 1 tablet (10 mg total) by mouth 3 (three) times daily as needed for muscle spasms. Patient not taking: Reported on 08/10/2018 07/05/18   Vena AustriaStaebler, Gelene Recktenwald, MD  ondansetron (ZOFRAN) 4 MG tablet Take 4 mg by mouth every 8 (eight) hours as needed for nausea or vomiting.    [provider]  Prenatal Vit-Fe Fumarate-FA (MULTIVITAMIN-PRENATAL) 27-0.8 MG TABS tablet Take 1 tablet by mouth daily at 12 noon.    [provider]   promethazine (PHENERGAN) 25 MG tablet Take 1 tablet (25 mg total) by mouth every 6 (six) hours as needed for nausea or vomiting. Patient not taking: Reported on 08/10/2018 07/28/18   Tresea MallGledhill, Jane, CNM    Physical Exam Blood pressure 112/76, pulse (!) 109, height 5\' 6"  (1.676 m), weight 148 lb (67.1 kg), last menstrual period 10/26/2017, currently breastfeeding.    General: NAD HEENT: normocephalic, anicteric Pulmonary: No increased work of breathing Abdomen: NABS, soft, non-tender, non-distended.  Umbilicus without lesions.  No hepatomegaly, splenomegaly or masses palpable. No evidence of hernia. Genitourinary:  External: Normal external female genitalia.  Normal urethral meatus, normal  Bartholin's and Skene's glands.    Vagina: Normal vaginal mucosa, no evidence of prolapse.    Cervix: Grossly normal in appearance, no bleeding  Uterus: Non-enlarged, mobile, normal contour.  No CMT  Adnexa: ovaries non-enlarged, no adnexal masses  Rectal: deferred Extremities: no edema, erythema, or tenderness Neurologic: Grossly intact Psychiatric: mood appropriate, affect full  Edinburgh Postnatal Depression Scale - 10/04/18 1443      Edinburgh Postnatal Depression Scale:  In the Past 7 Days   I have been able to laugh and see the funny side of things.  0    I have looked forward with enjoyment to things.  0    I have blamed myself unnecessarily when things went wrong.  1    I have been anxious or worried for no good reason.  0    I have felt scared or panicky for no good reason.  0    Things have been getting on top of me.  0    I have been so unhappy that I have had difficulty sleeping.  0    I have felt sad or miserable.  1    I have been so unhappy that I have been crying.  0    The thought of harming myself has occurred to me.  0    Edinburgh Postnatal Depression Scale Total  2       Edinburgh Postnatal Depression Scale - 10/04/18 1443      Edinburgh Postnatal Depression Scale:  In  the Past 7 Days   I have been able to laugh and see the funny side of things.  0    I have looked forward with enjoyment to things.  0    I have blamed myself unnecessarily when things went wrong.  1    I have been anxious or worried for no good reason.  0    I have felt scared or panicky for no good reason.  0    Things have been getting on top of me.  0    I have been so unhappy that I have had difficulty sleeping.  0    I have felt sad or miserable.  1    I have been so unhappy that I have been crying.  0    The thought of harming myself has occurred to me.  0    Edinburgh Postnatal Depression Scale Total  2        Assessment: 28 y.o. [id number] presenting for 6 week postpartum visit  Plan: Problem List Items Addressed This Visit    None    Visit Diagnoses    6 weeks postpartum follow-up    -  Primary       1) Contraception - Education given regarding options for contraception, as well as compatibility with breast feeding if applicable.  Patient plans on vasectomy for contraception.  2)  Pap - ASCCP guidelines and rational discussed.  ASCCP guidelines and rational discussed.  Patient opts for every 3 years screening interval  3) Patient underwent screening for postpartum depression with no signs of depression  4) Return in about 1 year (around 10/04/2019) for annual.   Vena Austria, MD, [facility], [facility] 10/04/2018, 2:43 PM

## 2018-11-26 ENCOUNTER — Telehealth: Payer: Self-pay

## 2018-11-26 NOTE — Telephone Encounter (Signed)
Pt calling to see if safe to take muscle relaxer while breastfeeding; doesn't know the name of it but it's in chart; and has questions about 1st period since delivery.  [phone]  Pt was rx'd flexeril during preg.  Adv usually what you can take while preg you can take while breastfeeding.  Pt's first period is heavier that normal - changing pads q2hrs.  Adv normal for first period to be heavier.  We just don't want her to saturate a pad q20min-1hr.

## 2018-11-29 ENCOUNTER — Telehealth: Payer: Self-pay

## 2018-11-29 NOTE — Telephone Encounter (Signed)
Please advise. Thank you

## 2018-11-29 NOTE — Telephone Encounter (Signed)
Pt calling; would like to speak c AMS.  [phone]

## 2018-11-30 NOTE — Telephone Encounter (Signed)
Patient is schedule 12/02/18 with SDJ in [place] [facility]

## 2018-11-30 NOTE — Telephone Encounter (Signed)
Needs to be seen or have a phone visit with any available provider

## 2018-11-30 NOTE — Telephone Encounter (Signed)
Pt calling; is asking for rx for postpartum depression; is breastfeeding; is allergic to zoloft.  [phone]

## 2018-12-02 ENCOUNTER — Ambulatory Visit (INDEPENDENT_AMBULATORY_CARE_PROVIDER_SITE_OTHER): Payer: Medicaid Other | Admitting: Obstetrics and Gynecology

## 2018-12-02 ENCOUNTER — Encounter: Payer: Self-pay | Admitting: Obstetrics and Gynecology

## 2018-12-02 ENCOUNTER — Other Ambulatory Visit: Payer: Self-pay

## 2018-12-02 VITALS — Ht 66.0 in

## 2018-12-02 DIAGNOSIS — F419 Anxiety disorder, unspecified: Secondary | ICD-10-CM | POA: Diagnosis not present

## 2018-12-02 DIAGNOSIS — F329 Major depressive disorder, single episode, unspecified: Secondary | ICD-10-CM | POA: Diagnosis not present

## 2018-12-02 DIAGNOSIS — F32A Depression, unspecified: Secondary | ICD-10-CM | POA: Insufficient documentation

## 2018-12-02 MED ORDER — BUPROPION HCL ER (XL) 150 MG PO TB24: 150.0000 mg | ORAL_TABLET | Freq: Two times a day (BID) | ORAL | 0 refills | Status: DC

## 2018-12-02 NOTE — Progress Notes (Signed)
Virtual Visit via Telephone Note  I connected with Etta Gassett on 12/02/18 at  4:10 PM EDT by telephone and verified that I am speaking with the correct person using two identifiers.   I discussed the limitations, risks, security and privacy concerns of performing an evaluation and management service by telephone and the availability of in person appointments. I also discussed with the patient that there may be a patient responsible charge related to this service. The patient expressed understanding and agreed to proceed.  The patient was at home I spoke with the patient from my  office The names of people involved in this encounter were: Mckala Artis Delay and Prentice Docker, MD.   History of Present Illness: 28 y.o. [phone] who presents for issues of depression.  She delivered via vaginal delivery on 3/13 at [facility]. She can't tell whether she has depression or anxiety.  Sometimes she feels great. Others she feels not as well. By the end of the day she is mentally exhausted.  She states that she also has "hair pin trigger" for stuff "pissing her off."  This I much different than normal for her.   She is not sleeping well.  She can start off sleeping but she wakes up early and has a hard time getting back to sleep.  She is unable to take naps during the day.  She goes from no appetite to having an insatiable appetite.  It is more common for her not to have an appetite.  She has a history of anxiety, but she thinks it was related to the birth control she was on.   She has no history of depression.   She is exclusively breastfeeding.  She has tried Zoloft in the past and her face swells and her tongue. She wound up in the ER. Her husband had a vasectomy and she is otherwise not taking anything for contraception.    Observations/Objective: Physical Exam could not be performed. Because of the COVID-19 outbreak this visit was performed over the phone and not in person.   Depression  screen PHQ 2/9 12/02/2018  Decreased Interest 2  Down, Depressed, Hopeless 1  PHQ - 2 Score 3  Altered sleeping 3  Tired, decreased energy 2  Change in appetite 3  Feeling bad or failure about yourself  2  Trouble concentrating 2  Moving slowly or fidgety/restless 0  Suicidal thoughts 0  PHQ-9 Score 15  Difficult doing work/chores Very difficult     Assessment and Plan: 1. Anxiety and depression      Follow Up Instructions: Discussed multiple methods of treatment. We discussed counseling, medication, and the combination of the two (being the best option). She states that she does not have the time to see a Social worker.  She would like to try medication. She has had a severe allergic reaction to Zoloft. So, will avoid SSRIs.  Will try wellbutrin and titrate up slowly.  It was discussed with her that if she feels suicidal ideation she should go to the ER immediately. She is able to contract for safety today and denies SI/HI.    I discussed the assessment and treatment plan with the patient. The patient was provided an opportunity to ask questions and all were answered. The patient agreed with the plan and demonstrated an understanding of the instructions.   The patient was advised to call back or seek an in-person evaluation if the symptoms worsen or if the condition fails to improve as anticipated.  I  provided 22 minutes of non-face-to-face time during this encounter.  Thomasene MohairStephen Sina Sumpter, MD  [facility], [facility] 12/02/2018 5:19 PM

## 2018-12-03 ENCOUNTER — Encounter: Payer: Self-pay | Admitting: Obstetrics and Gynecology

## 2018-12-31 ENCOUNTER — Encounter: Payer: Self-pay | Admitting: Obstetrics and Gynecology

## 2018-12-31 ENCOUNTER — Ambulatory Visit (INDEPENDENT_AMBULATORY_CARE_PROVIDER_SITE_OTHER): Payer: Medicaid Other | Admitting: Obstetrics and Gynecology

## 2018-12-31 ENCOUNTER — Telehealth: Payer: Self-pay | Admitting: Obstetrics and Gynecology

## 2018-12-31 ENCOUNTER — Other Ambulatory Visit: Payer: Self-pay

## 2018-12-31 DIAGNOSIS — F419 Anxiety disorder, unspecified: Secondary | ICD-10-CM

## 2018-12-31 DIAGNOSIS — F329 Major depressive disorder, single episode, unspecified: Secondary | ICD-10-CM | POA: Diagnosis not present

## 2018-12-31 MED ORDER — BUPROPION HCL ER (XL) 150 MG PO TB24: 150.0000 mg | ORAL_TABLET | Freq: Every day | ORAL | 1 refills | Status: AC

## 2018-12-31 NOTE — Progress Notes (Signed)
    Virtual Visit via Telephone Note  I connected with Tanya Mccarthy on 12/31/18 at  9:50 AM EDT by telephone and verified that I am speaking with the correct person using two identifiers.   I discussed the limitations, risks, security and privacy concerns of performing an evaluation and management service by telephone and the availability of in person appointments. I also discussed with the patient that there may be a patient responsible charge related to this service. The patient expressed understanding and agreed to proceed.  The patient was at home I spoke with the patient from my  office The names of people involved in this encounter were: Tanya Mccarthy and Prentice Docker, MD.   History of Present Illness: 28 y.o. [phone] female who has had anxiety/depression who presents in follow up from starting wellbutrin. She did have some early side effects as she was taking the medication in the morning. Now she is taking the medication at night. She is taking 150 mg.  Her GAD 7 was 3 today. She is satisfied with her current level of control of her symptoms.  She denies SI and HI.  She has no current side effects from the medication.   Observations/Objective:  Physical Exam could not be performed. Because of the COVID-19 outbreak this visit was performed over the phone and not in person.   Assessment and Plan: Anxiety and depression  Follow Up Instructions: Follow up in 2 months to reassess. Follow up sooner, if symptoms worsen. Reviewed precautions of SI/HI and advice to go to ER for these circumstances.    I discussed the assessment and treatment plan with the patient. The patient was provided an opportunity to ask questions and all were answered. The patient agreed with the plan and demonstrated an understanding of the instructions.   The patient was advised to call back or seek an in-person evaluation if the symptoms worsen or if the condition fails to improve as anticipated.  I  provided 15:47 minutes of non-face-to-face time during this encounter.  Prentice Docker, MD  [facility], [facility] 12/31/2018 10:29 AM

## 2018-12-31 NOTE — Telephone Encounter (Signed)
Called and left voice mail for patient to call back to be schedule °

## 2018-12-31 NOTE — Telephone Encounter (Signed)
-----   Message from Will Bonnet, MD sent at 12/31/2018 10:37 AM EDT ----- Regarding: Schedule follow up appt Please call patient to schedule a follow-up appointment for medication in about 2 months with me.Thank you! Prentice Docker, MD

## 2018-12-31 NOTE — Telephone Encounter (Signed)
Called pt and left vm to call back to schedule.

## 2019-03-15 ENCOUNTER — Telehealth: Payer: Self-pay

## 2019-03-15 NOTE — Telephone Encounter (Signed)
Called pt and adv we do not do telehealth appts for UTI b/c specimen needs to be tested.  Pt is not able to come in b/c her car is broke down and will not get it back until next week.  Adv if someone could drive her to appt here or and urgent care or [facility].  Can take AZO Standard for the pain, cranberry juice (if too sweet to mix half and half c ginger ale), push fluids.

## 2019-03-15 NOTE — Telephone Encounter (Signed)
Pt calling c/o UTI; is hoping to have a telehealth appt and get rx.  [phone]

## 2020-10-27 IMAGING — US US ABDOMEN LIMITED
1 series · 14 of 25 positions shown · non-contrast
Comparison: None.

CLINICAL DATA: Right upper quadrant pain. Thirty-seven week
pregnant female. No

EXAM:
ULTRASOUND ABDOMEN LIMITED RIGHT UPPER QUADRANT

[Series 1: us abdomen limited · 0.19mm/px · 14 of 34 slices shown]
[im 1/34]
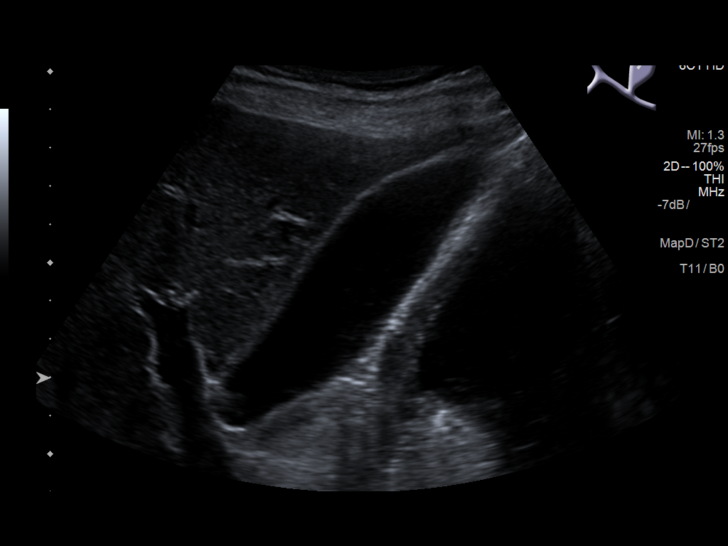
[im 3/34]
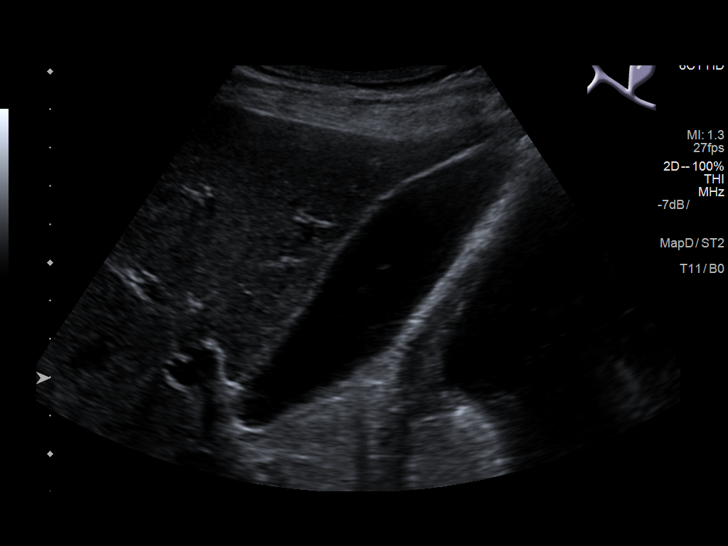
[im 6/34]
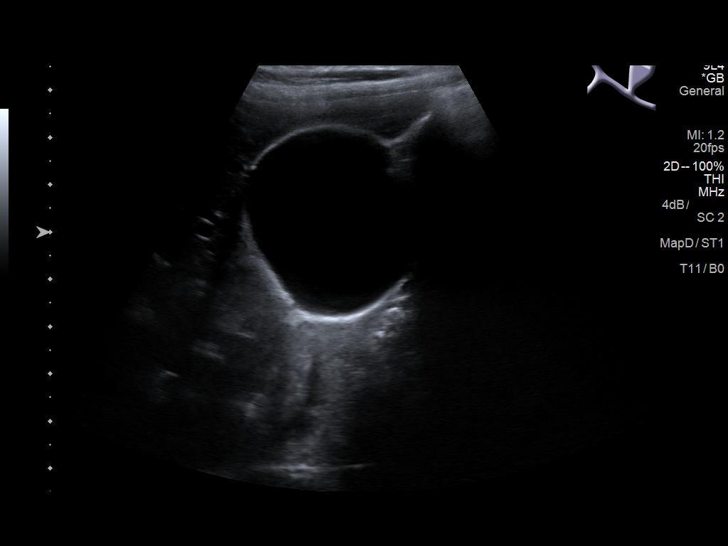
[im 9/34]
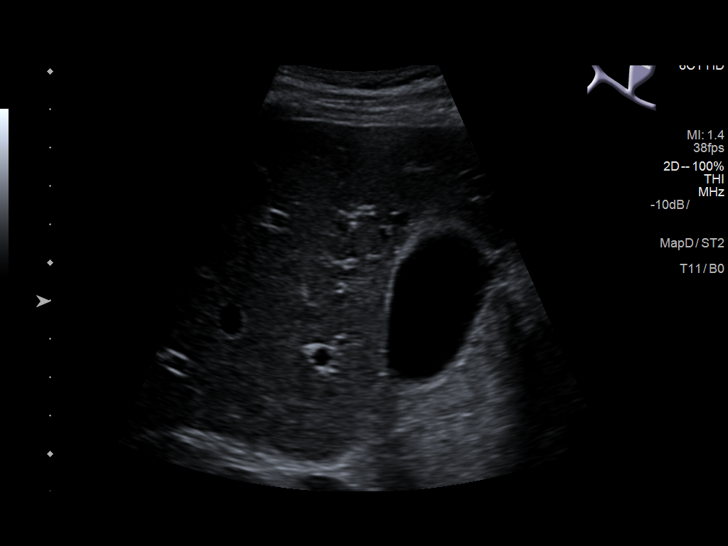
[im 12/34]
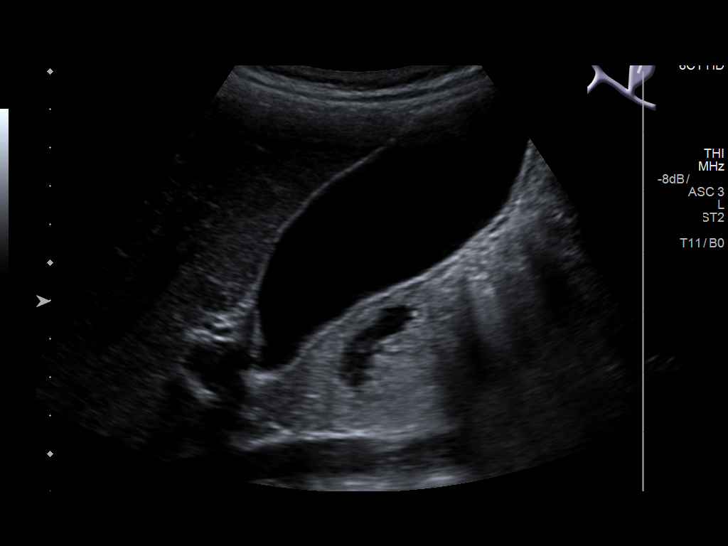
[im 13/34]
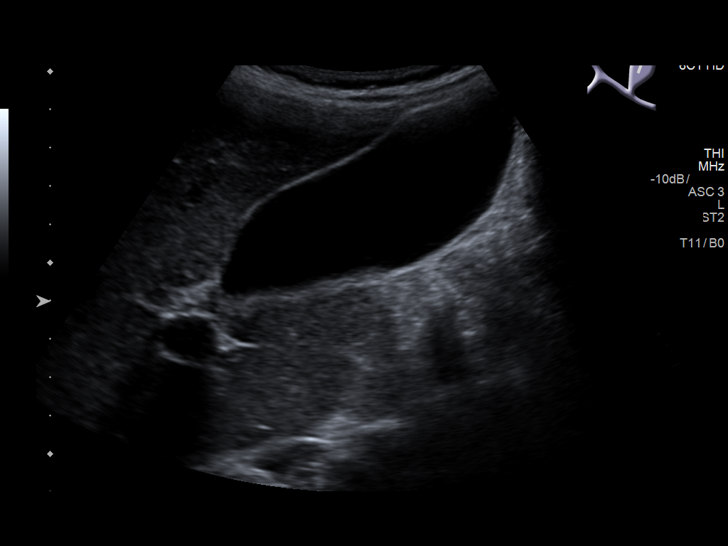
[im 16/34]
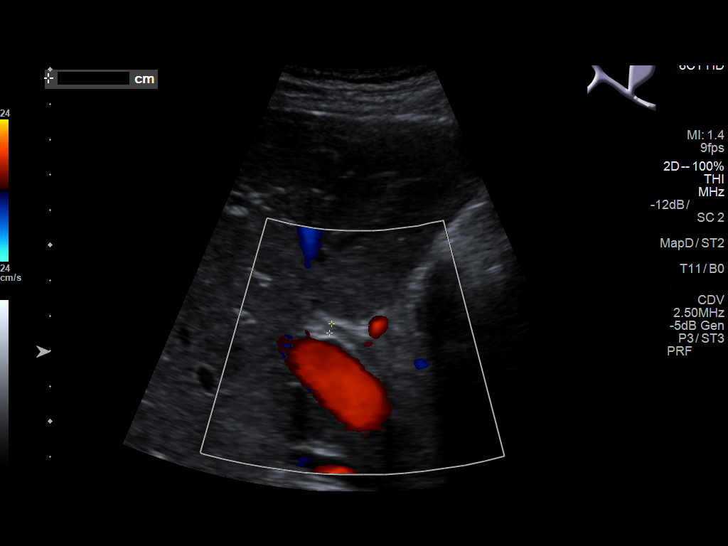
[im 18/34]
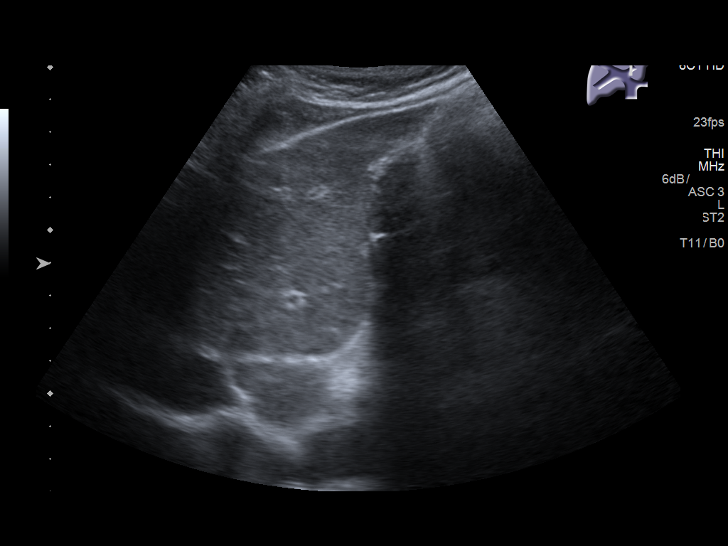
[im 21/34]
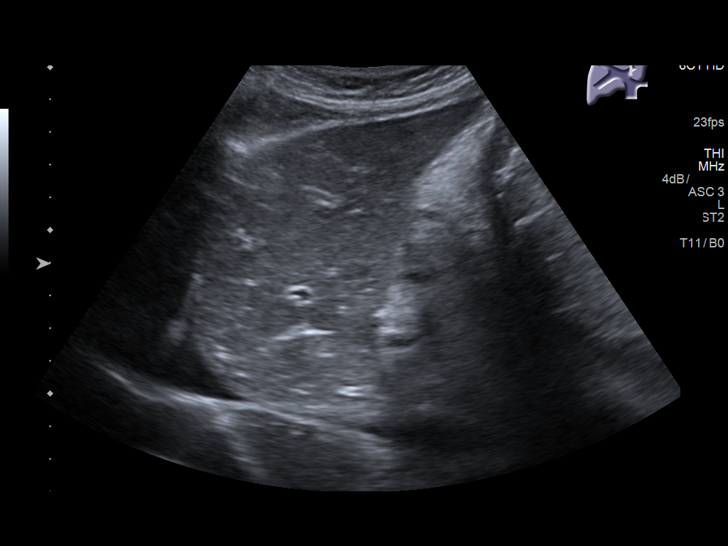
[im 23/34]
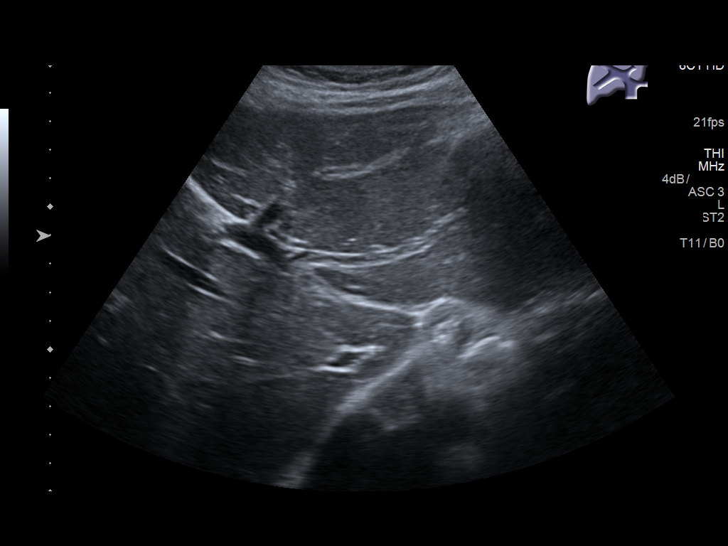
[im 25/34]
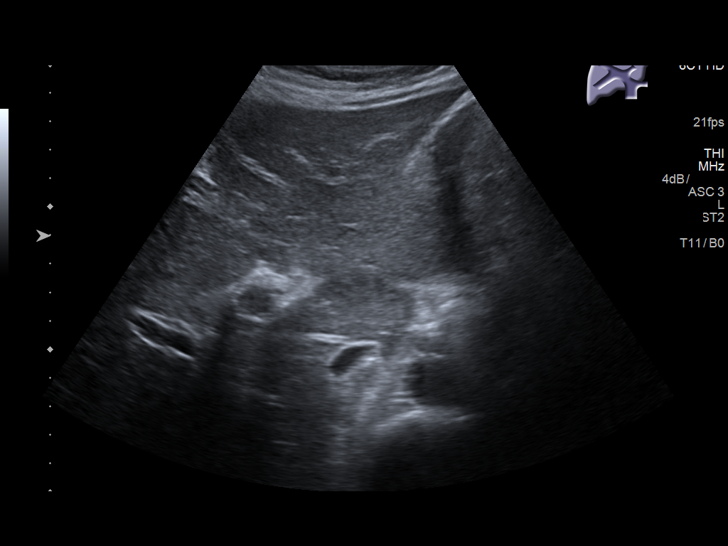
[im 28/34]
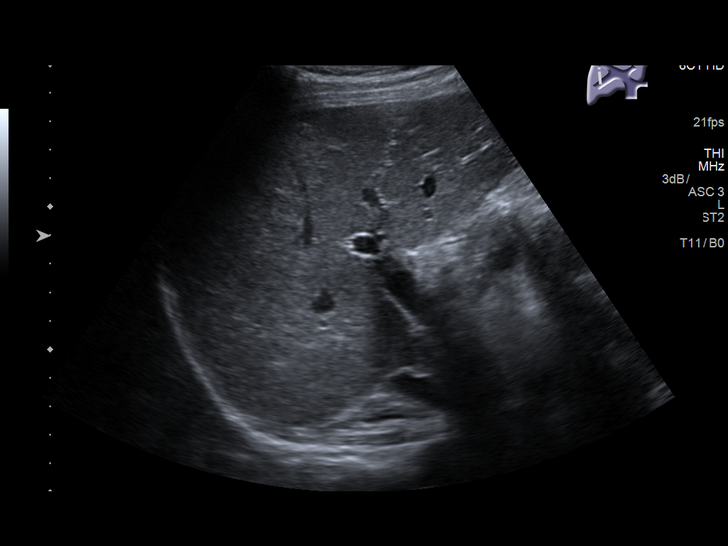
[im 31/34]
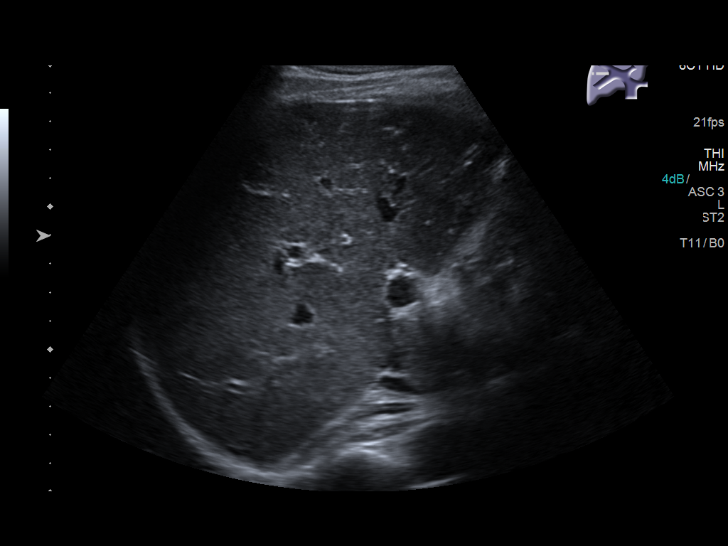
[im 34/34]
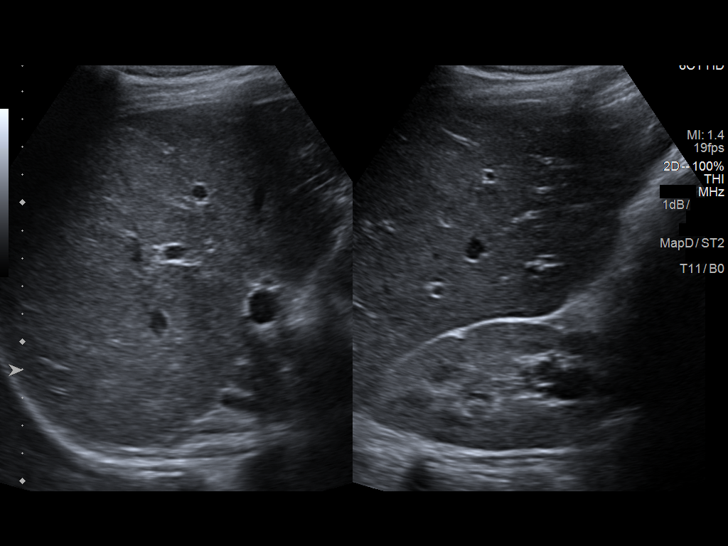

[14 of 25 positions shown; findings below may reference images not displayed]

FINDINGS: Gallbladder:

No gallstones or wall thickening visualized. 3 mm single wall
thickness of the gallbladder. No sonographic Murphy sign noted by
sonographer.

Common bile duct:

Diameter: 3 mm

Liver:

No focal lesion identified. Within normal limits in parenchymal
echogenicity. Portal vein is patent on color Doppler imaging with
normal direction of blood flow towards the liver.

Other: The partially included right kidney suggest presence of mild
right-sided hydronephrosis that may be due to the gravid uterus.
IMPRESSION: Normal right upper quadrant abdominal ultrasound.

Fullness of the right intrarenal collecting system question mild
hydronephrosis secondary to the patient's gravid uterus.
